# Patient Record
Sex: Female | Born: 1990 | Race: White | Hispanic: Yes | Marital: Married | Smoking: Never smoker
Health system: Southern US, Community
[De-identification: ages and names within clinical notes are randomized; demographics above are authoritative.]

## PROBLEM LIST (undated history)

## (undated) DIAGNOSIS — R42 Dizziness and giddiness: Secondary | ICD-10-CM

## (undated) DIAGNOSIS — G43909 Migraine, unspecified, not intractable, without status migrainosus: Secondary | ICD-10-CM

## (undated) DIAGNOSIS — T7421XA Adult sexual abuse, confirmed, initial encounter: Secondary | ICD-10-CM

## (undated) DIAGNOSIS — F5082 Avoidant/restrictive food intake disorder: Secondary | ICD-10-CM

## (undated) DIAGNOSIS — R202 Paresthesia of skin: Secondary | ICD-10-CM

## (undated) DIAGNOSIS — R109 Unspecified abdominal pain: Secondary | ICD-10-CM

## (undated) DIAGNOSIS — F509 Eating disorder, unspecified: Secondary | ICD-10-CM

## (undated) HISTORY — DX: Dizziness and giddiness: R42

## (undated) HISTORY — DX: Migraine, unspecified, not intractable, without status migrainosus: G43.909

## (undated) HISTORY — DX: Paresthesia of skin: R20.2

## (undated) HISTORY — DX: Unspecified abdominal pain: R10.9

---

## 2013-09-12 ENCOUNTER — Encounter (HOSPITAL_COMMUNITY): Payer: Self-pay | Admitting: Emergency Medicine

## 2013-09-12 ENCOUNTER — Emergency Department (HOSPITAL_COMMUNITY)
Admission: EM | Admit: 2013-09-12 | Discharge: 2013-09-12 | Disposition: A | Discharge status: Home / Self Care | Payer: Medicaid Other | Attending: Emergency Medicine | Admitting: Emergency Medicine

## 2013-09-12 DIAGNOSIS — M542 Cervicalgia: Secondary | ICD-10-CM | POA: Insufficient documentation

## 2013-09-12 DIAGNOSIS — Y939 Activity, unspecified: Secondary | ICD-10-CM | POA: Insufficient documentation

## 2013-09-12 DIAGNOSIS — W1809XA Striking against other object with subsequent fall, initial encounter: Secondary | ICD-10-CM | POA: Insufficient documentation

## 2013-09-12 DIAGNOSIS — W010XXA Fall on same level from slipping, tripping and stumbling without subsequent striking against object, initial encounter: Secondary | ICD-10-CM | POA: Insufficient documentation

## 2013-09-12 DIAGNOSIS — G8911 Acute pain due to trauma: Secondary | ICD-10-CM | POA: Insufficient documentation

## 2013-09-12 DIAGNOSIS — S298XXA Other specified injuries of thorax, initial encounter: Secondary | ICD-10-CM | POA: Insufficient documentation

## 2013-09-12 DIAGNOSIS — Y929 Unspecified place or not applicable: Secondary | ICD-10-CM | POA: Insufficient documentation

## 2013-09-12 DIAGNOSIS — S79929A Unspecified injury of unspecified thigh, initial encounter: Secondary | ICD-10-CM

## 2013-09-12 DIAGNOSIS — IMO0002 Reserved for concepts with insufficient information to code with codable children: Secondary | ICD-10-CM | POA: Insufficient documentation

## 2013-09-12 DIAGNOSIS — S0993XA Unspecified injury of face, initial encounter: Secondary | ICD-10-CM | POA: Insufficient documentation

## 2013-09-12 DIAGNOSIS — S6990XA Unspecified injury of unspecified wrist, hand and finger(s), initial encounter: Secondary | ICD-10-CM | POA: Insufficient documentation

## 2013-09-12 DIAGNOSIS — S59909A Unspecified injury of unspecified elbow, initial encounter: Secondary | ICD-10-CM | POA: Insufficient documentation

## 2013-09-12 DIAGNOSIS — S59919A Unspecified injury of unspecified forearm, initial encounter: Secondary | ICD-10-CM

## 2013-09-12 DIAGNOSIS — S79919A Unspecified injury of unspecified hip, initial encounter: Secondary | ICD-10-CM | POA: Insufficient documentation

## 2013-09-12 DIAGNOSIS — S199XXA Unspecified injury of neck, initial encounter: Secondary | ICD-10-CM

## 2013-09-12 DIAGNOSIS — W19XXXA Unspecified fall, initial encounter: Secondary | ICD-10-CM

## 2013-09-12 DIAGNOSIS — S0990XA Unspecified injury of head, initial encounter: Secondary | ICD-10-CM | POA: Insufficient documentation

## 2013-09-12 MED ORDER — IBUPROFEN 600 MG PO TABS: 600.0000 mg | ORAL_TABLET | Freq: Four times a day (QID) | ORAL | Status: DC | PRN

## 2013-09-12 NOTE — ED Notes (Signed)
Pt unable to urinate at this time.  

## 2013-09-12 NOTE — ED Provider Notes (Signed)
CSN: [id number]     Arrival date & time 09/12/13  0102 History   First MD Initiated Contact with Patient 09/12/13 0200     Chief Complaint  Patient presents with  . Fall     (Consider location/radiation/quality/duration/timing/severity/associated sxs/prior Treatment) HPI Comments: Patient states she slipped on the ice yesterday morning about 7 AM, hitting the back of her head on the concrete.  No loss of consciousness.  She's been having headache today.  Has not taken any medication.  She also has left elbow, left posterior chest, left lower back, and hip pain from her fall.  She has been able to go to school throughout the day.  Has been able to work after school. Patient.  Has not had any visual disturbances, nausea. She, states at first, she had some paresthesia of her left arm  Patient is a 23 y.o. female presenting with fall. The history is provided by the patient.  Fall This is a new problem. The current episode started yesterday. The problem occurs constantly. The problem has been unchanged. Associated symptoms include headaches and neck pain. Pertinent negatives include no fever, joint swelling, nausea or visual change. The symptoms are aggravated by exertion. She has tried nothing for the symptoms. The treatment provided no relief.    History reviewed. No pertinent past medical history. History reviewed. No pertinent past surgical history. No family history on file. History  Substance Use Topics  . Smoking status: Never Smoker   . Smokeless tobacco: Not on file  . Alcohol Use: No   OB History   Grav Para Term Preterm Abortions TAB SAB Ect Mult Living                 Review of Systems  Constitutional: Negative for fever.  Eyes: Negative for visual disturbance.  Gastrointestinal: Negative for nausea.  Musculoskeletal: Positive for back pain and neck pain. Negative for gait problem and joint swelling.  Neurological: Positive for headaches. Negative for dizziness.  All  other systems reviewed and are negative.      Allergies  Review of patient's allergies indicates no known allergies.  Home Medications   Current Outpatient Rx  Name  Route  Sig  Dispense  Refill  . ibuprofen (ADVIL,MOTRIN) 600 MG tablet   Oral   Take 1 tablet (600 mg total) by mouth every 6 (six) hours as needed.   30 tablet   0    BP 125/83  Pulse 80  Temp(Src) 98.4 F (36.9 C) (Oral)  Resp 16  SpO2 100% Physical Exam  Nursing note and vitals reviewed. Constitutional: She is oriented to person, place, and time. She appears well-developed and well-nourished.  HENT:  Head: Normocephalic.    Eyes: Pupils are equal, round, and reactive to light.  Neck: Normal range of motion.  Cardiovascular: Normal rate and regular rhythm.   Pulmonary/Chest: Effort normal.  Abdominal: Soft. Bowel sounds are normal.  Musculoskeletal: Normal range of motion. She exhibits tenderness.       Back:       Arms: Neurological: She is alert and oriented to person, place, and time. Coordination normal.  Skin: Skin is warm. No erythema.    ED Course  Procedures (including critical care time) Labs Review Labs Reviewed  PREGNANCY, URINE   Imaging Review No results found.   EKG Interpretation None      MDM  Patient was offered x-rays, refused at this time.  She's been given cautions to prompt a return she understands the risks  Final diagnoses:  Fall  Head injury  Elbow injury  Hip injury         Arman Filter, NP 09/12/13 [phone]

## 2013-09-12 NOTE — Discharge Instructions (Signed)
Return for any change in your condition

## 2013-09-12 NOTE — ED Notes (Signed)
Patient fell [place] her apartment [place], slipped on black ice.  She fell back hit her head and was seen here and treated yesterday here in the ED.  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  She presents today with neck pain and her head does not hurt unless she touches the sore spot.  She wants to see if she can have the x-ray done today.  She is still not wanting the CT but thinks she needs an x-ray.  She rates her pain in her neck 7/10.  She has taken ibuprofen for the pain and it is not helping.

## 2013-09-12 NOTE — ED Notes (Signed)
The pt ifell 0700am today in the ice striking the back of her head no loc.  She is also c/o a headache now with lt chest and  Posterior chest pain and lt arm pain.  She can now move the arm better than earlier.  Her headache is getting wrose

## 2013-09-13 ENCOUNTER — Emergency Department (HOSPITAL_COMMUNITY)
Admission: EM | Admit: 2013-09-13 | Discharge: 2013-09-13 | Discharge status: Against Medical Advice | Payer: Medicaid Other | Attending: Emergency Medicine | Admitting: Emergency Medicine

## 2013-09-13 NOTE — ED Provider Notes (Signed)
Medical screening examination/treatment/procedure(s) were performed by non-physician practitioner and as supervising physician I was immediately available for consultation/collaboration.   Tiani Stanbery, MD 09/13/13 0756 

## 2013-09-13 NOTE — ED Notes (Addendum)
Pt up to desk, wanting to leave. Explained to pt we have her room ready, we are taking you back right now. She was apologetic and stated she just wanted to go home and lie down. Stated, we have a place for you to lie down right now. She declined. Family with her. She stated, she was here last night, and that she would just come back in the morning. Encouraged her to stay to speak with provider before deciding to leave, that "I would get them now", she declined. Encouraged to stay, and/or: return if needed, changes mind, develops new or worsening sx. Alert, NAD, calm, steady gait.

## 2014-02-03 ENCOUNTER — Encounter (HOSPITAL_COMMUNITY): Payer: Self-pay | Admitting: Emergency Medicine

## 2014-02-03 ENCOUNTER — Emergency Department (HOSPITAL_COMMUNITY)
Admission: EM | Admit: 2014-02-03 | Discharge: 2014-02-03 | Disposition: A | Discharge status: Home / Self Care | Payer: Medicaid Other | Attending: Emergency Medicine | Admitting: Emergency Medicine

## 2014-02-03 DIAGNOSIS — B9689 Other specified bacterial agents as the cause of diseases classified elsewhere: Secondary | ICD-10-CM | POA: Insufficient documentation

## 2014-02-03 DIAGNOSIS — Z3202 Encounter for pregnancy test, result negative: Secondary | ICD-10-CM | POA: Insufficient documentation

## 2014-02-03 DIAGNOSIS — A499 Bacterial infection, unspecified: Secondary | ICD-10-CM | POA: Insufficient documentation

## 2014-02-03 DIAGNOSIS — N760 Acute vaginitis: Secondary | ICD-10-CM | POA: Insufficient documentation

## 2014-02-03 DIAGNOSIS — N921 Excessive and frequent menstruation with irregular cycle: Secondary | ICD-10-CM

## 2014-02-03 DIAGNOSIS — N898 Other specified noninflammatory disorders of vagina: Secondary | ICD-10-CM | POA: Insufficient documentation

## 2014-02-03 LAB — WET PREP, GENITAL
Trich, Wet Prep: NONE SEEN
Yeast Wet Prep HPF POC: NONE SEEN

## 2014-02-03 LAB — PREGNANCY, URINE: Preg Test, Ur: NEGATIVE

## 2014-02-03 LAB — URINALYSIS, ROUTINE W REFLEX MICROSCOPIC
Bilirubin Urine: NEGATIVE
Glucose, UA: NEGATIVE mg/dL
KETONES UR: NEGATIVE mg/dL
LEUKOCYTES UA: NEGATIVE
NITRITE: NEGATIVE
PH: 6 (ref 5.0–8.0)
PROTEIN: NEGATIVE mg/dL
Specific Gravity, Urine: 1.02 (ref 1.005–1.030)
UROBILINOGEN UA: 0.2 mg/dL (ref 0.0–1.0)

## 2014-02-03 LAB — I-STAT CHEM 8, ED
BUN: 9 mg/dL (ref 6–23)
CHLORIDE: 104 meq/L (ref 96–112)
Calcium, Ion: 1.18 mmol/L (ref 1.12–1.23)
Creatinine, Ser: 0.8 mg/dL (ref 0.50–1.10)
Glucose, Bld: 98 mg/dL (ref 70–99)
HCT: 39 % (ref 36.0–46.0)
Hemoglobin: 13.3 g/dL (ref 12.0–15.0)
Potassium: 4 mEq/L (ref 3.7–5.3)
Sodium: 140 mEq/L (ref 137–147)
TCO2: 24 mmol/L (ref 0–100)

## 2014-02-03 LAB — URINE MICROSCOPIC-ADD ON

## 2014-02-03 MED ORDER — METRONIDAZOLE 500 MG PO TABS: 500.0000 mg | ORAL_TABLET | Freq: Two times a day (BID) | ORAL | Status: DC

## 2014-02-03 NOTE — ED Provider Notes (Signed)
CSN: [id number]     Arrival date & time 02/03/14  1124 History   First MD Initiated Contact with Patient 02/03/14 1236     Chief Complaint  Patient presents with  . Vaginal Bleeding  . Urinary Frequency     HPI Pt was seen at 1310. Per pt, c/o gradual onset and persistence of multiple intermittent episodes of vaginal bleeding for the past month. Pt states she had her usual menses the beginning of the month. Pt took "Plan B" after her usual menses ended, and states she "started to bleed again" a few days later. Pt states that vaginal bleeding was "darker and heavier." Pt states now she is "just spotting." States she took Plan B "because I had sex and I wanted to reassure the guy I wasn't pregnant." Denies vaginal discharge, no CP/palpitations, no SOB, no abd pain, no back pain, no fevers, no rash.    History reviewed. No pertinent past medical history.  History reviewed. No pertinent past surgical history.  History  Substance Use Topics  . Smoking status: Never Smoker   . Smokeless tobacco: Not on file  . Alcohol Use: No    Review of Systems .ROS: Statement: All systems negative except as marked or noted in the HPI; Constitutional: Negative for fever and chills. ; ; Eyes: Negative for eye pain, redness and discharge. ; ; ENMT: Negative for ear pain, hoarseness, nasal congestion, sinus pressure and sore throat. ; ; Cardiovascular: Negative for chest pain, palpitations, diaphoresis, dyspnea and peripheral edema. ; ; Respiratory: Negative for cough, wheezing and stridor. ; ; Gastrointestinal: Negative for nausea, vomiting, diarrhea, abdominal pain, blood in stool, hematemesis, jaundice and rectal bleeding. . ; ; Genitourinary: Negative for dysuria, flank pain and hematuria. ; ; GYN:  +vaginal bleeding, no vaginal discharge, no vulvar pain.;;  Musculoskeletal: Negative for back pain and neck pain. Negative for swelling and trauma.; ; Skin: Negative for pruritus, rash, abrasions, blisters,  bruising and skin lesion.; ; Neuro: Negative for headache, lightheadedness and neck stiffness. Negative for weakness, altered level of consciousness , altered mental status, extremity weakness, paresthesias, involuntary movement, seizure and syncope.       Allergies  Review of patient's allergies indicates no known allergies.  Home Medications   Prior to Admission medications   Medication Sig Start Date End Date Taking? Authorizing Provider  levonorgestrel (PLAN B,NEXT CHOICE) 0.75 MG tablet Take 0.75 mg by mouth every 12 (twelve) hours.   Yes Historical Provider, MD   BP 119/75  Pulse 75  Temp(Src) 98.4 F (36.9 C) (Oral)  Resp 16  SpO2 98%  LMP 01/24/2014 Physical Exam 1315: Physical examination:  Nursing notes reviewed; Vital signs and O2 SAT reviewed;  Constitutional: Well developed, Well nourished, Well hydrated, In no acute distress; Head:  Normocephalic, atraumatic; Eyes: EOMI, PERRL, No scleral icterus; ENMT: Mouth and pharynx normal, Mucous membranes moist; Neck: Supple, Full range of motion, No lymphadenopathy; Cardiovascular: Regular rate and rhythm, No murmur, rub, or gallop; Respiratory: Breath sounds clear & equal bilaterally, No rales, rhonchi, wheezes.  Speaking full sentences with ease, Normal respiratory effort/excursion; Chest: Nontender, Movement normal; Abdomen: Soft, Nontender, Nondistended, Normal bowel sounds; Genitourinary: No CVA tenderness. Pelvic exam performed with permission of pt and female ED tech assist during exam.  External genitalia w/o lesions. Vaginal vault without discharge, +small amount of dark blood in vault.  Cervix w/o lesions, not friable, GC/chlam and wet prep obtained and sent to lab.  Bimanual exam w/o CMT, uterine or adnexal tenderness.;; Extremities:  Pulses normal, No tenderness, No edema, No calf edema or asymmetry.; Neuro: AA&Ox3, Major CN grossly intact.  Speech clear. No gross focal motor or sensory deficits in extremities. Climbs on and  off stretcher easily by herself. Gait steady.; Skin: Color normal, Warm, Dry.   ED Course  Procedures     MDM  MDM Reviewed: previous chart, nursing note and vitals Interpretation: labs   Results for orders placed during the hospital encounter of 02/03/14  WET PREP, GENITAL      Result Value Ref Range   Yeast Wet Prep HPF POC NONE SEEN  NONE SEEN   Trich, Wet Prep NONE SEEN  NONE SEEN   Clue Cells Wet Prep HPF POC FEW (*) NONE SEEN   WBC, Wet Prep HPF POC FEW (*) NONE SEEN  URINALYSIS, ROUTINE W REFLEX MICROSCOPIC      Result Value Ref Range   Color, Urine AMBER (*) YELLOW   APPearance CLOUDY (*) CLEAR   Specific Gravity, Urine 1.020  1.005 - 1.030   pH 6.0  5.0 - 8.0   Glucose, UA NEGATIVE  NEGATIVE mg/dL   Hgb urine dipstick LARGE (*) NEGATIVE   Bilirubin Urine NEGATIVE  NEGATIVE   Ketones, ur NEGATIVE  NEGATIVE mg/dL   Protein, ur NEGATIVE  NEGATIVE mg/dL   Urobilinogen, UA 0.2  0.0 - 1.0 mg/dL   Nitrite NEGATIVE  NEGATIVE   Leukocytes, UA NEGATIVE  NEGATIVE  PREGNANCY, URINE      Result Value Ref Range   Preg Test, Ur NEGATIVE  NEGATIVE  URINE MICROSCOPIC-ADD ON      Result Value Ref Range   Squamous Epithelial / LPF RARE  RARE   WBC, UA 3-6  <3 WBC/hpf   RBC / HPF TOO NUMEROUS TO COUNT  <3 RBC/hpf   Bacteria, UA FEW (*) RARE  I-STAT CHEM 8, ED      Result Value Ref Range   Sodium 140  137 - 147 mEq/L   Potassium 4.0  3.7 - 5.3 mEq/L   Chloride 104  96 - 112 mEq/L   BUN 9  6 - 23 mg/dL   Creatinine, Ser 4.090.80  0.50 - 1.10 mg/dL   Glucose, Bld 98  70 - 99 mg/dL   Calcium, Ion 8.111.18  9.141.12 - 1.23 mmol/L   TCO2 24  0 - 100 mmol/L   Hemoglobin 13.3  12.0 - 15.0 g/dL   HCT 78.239.0  95.636.0 - 21.346.0 %    1425:  Pt ambulatory with steady gait. VS remain stable. Vaginal bleeding likely due to taking Plan B. D/W pt regarding same; verb understanding. Will tx for BV. Pt wants to go home now. Dx and testing d/w pt.  Questions answered.  Verb understanding, agreeable to d/c  home with outpt f/u.     Laray AngerKathleen M Octavian Godek, DO 02/06/14 [id number]
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
## 2014-02-03 NOTE — Discharge Instructions (Signed)
°Emergency Department Resource Guide °1) Find a Doctor and Pay Out of Pocket °Although you won't have to find out who is covered by your insurance plan, it is a good idea to ask around and get recommendations. You will then need to call the office and see if the doctor you have chosen will accept you as a new patient and what types of options they offer for patients who are self-pay. Some doctors offer discounts or will set up payment plans for their patients who do not have insurance, but you will need to ask so you aren't surprised when you get to your appointment. ° °2) Contact Your Local Health Department °Not all health departments have doctors that can see patients for sick visits, but many do, so it is worth a call to see if yours does. If you don't know where your local health department is, you can check in your phone book. The CDC also has a tool to help you locate your state's health department, and many state websites also have listings of all of their local health departments. ° °3) Find a Walk-in Clinic °If your illness is not likely to be very severe or complicated, you may want to try a walk in clinic. These are popping up all over the country in pharmacies, drugstores, and shopping centers. They're usually staffed by nurse practitioners or physician assistants that have been trained to treat common illnesses and complaints. They're usually fairly quick and inexpensive. However, if you have serious medical issues or chronic medical problems, these are probably not your best option. ° °No Primary Care Doctor: °- Call [facility] at  832-8000 - they can help you locate a primary care doctor that  accepts your insurance, provides certain services, etc. °- Physician Referral Service- 1-800-533-3463 ° °Chronic Pain Problems: °Organization         Address  Phone   Notes  °[facility]  (336) 297-2271 Patients need to be referred by their primary care doctor.  ° °Medication  Assistance: °Organization         Address  Phone   Notes  °[place] [facility] [address] °[place], [place] [address] (336) 641-8030 --Must be a resident of [place] °-- Must have NO insurance coverage whatsoever (no Medicaid/ Medicare, etc.) °-- The pt. MUST have a primary care doctor that directs their care regularly and follows them in the community °  °[facility]  (866) 331-1348   °[employer]  (888) 892-1162   ° °Agencies that provide inexpensive medical care: °Organization         Address  Phone   Notes  °[facility]  (336) 832-8035   °[facility]    (336) 832-7272   °[facility] [address] °[place], [place] [address] (336) 832-4777   °[facility] [address], °[place] (336) 271-4999   °[facility]    (336) 373-0678   °[facility]    (336) 272-1050   °[facility] ° [address], [place] Phone:  (336) 832-4444, Fax:  (336) 832-4440 Hours of Operation:  9 am - 6 pm, M-F.  Also accepts Medicaid/Medicare and self-pay.  °[facility] ° [address], [place] Phone: (336) 832-3150, Fax: (336) 832-3151. Hours of Operation:  8:30 am - 5:30 pm, M-F.  Also accepts Medicaid and self-pay.  °[facility] [place] [address], [place] Phone: (336) 878-6027   °[facility] [address], [place], [place] (336)723-1848, Ext. 123 Mondays & Thursdays: 7-9 AM.  First 15 patients are seen on a first come, first serve basis. °  ° °Medicaid-accepting [place] Providers: ° °Organization         Address  Phone   Notes  °[facility] [address], [place] (336) 641-2100 Also accepts self-pay patients.  °[facility] [address], [place] ° (336) 856-9996   °[facility] [address], [place]  (336) 288-8857   °[facility] [address] [place] [address], [place] (336) 299-7000   °Veita Bland [address], [place]  ° (336) 373-1557 Only accepts [place] Access Medicaid patients after they have their name applied to their card.  ° °Self-Pay (no insurance) in [place]: ° °Organization         Address  Phone   Notes  °Sickle Cell Patients, [facility] [address], [place] (336) 832-1970   °[facility] [address], [place] (336) 832-4400   °[facility] ° [address], [place] (336) 992-4800   °[facility]/Dr. Osei-Bonsu ° [address] [place] [address], [place] or [address], [place] (336) 841-8500 Phone number for both [place] and [place] locations is the same.  °[facility] [address], [place] (336) 299-0000   °[facility] [address] [place] [address], [place] or [address] (336) 852-7530 °(336) 878-2260   °[facility] [address], [place] (336) 350-1642, phone; (336) 294-5005, fax Sees patients 1st and 3rd Saturday of every month.  Must not qualify for public or private insurance (i.e. Medicaid, Medicare, [place] Health Choice, Veterans' Benefits) • Household income should be no more than 200% of the poverty level •The clinic cannot treat you if you are pregnant or think you are pregnant • Sexually transmitted diseases are not treated at the clinic.  ° ° °Dental Care: °Organization         Address  Phone  Notes  °[place] [facility] [address], [place] (336) 641-6152 Accepts children up to age 21 who are enrolled in Medicaid or [place] Health Choice; pregnant women with a Medicaid card; and children who have applied for Medicaid or [place] Health Choice, but were declined, whose parents can pay a reduced fee at time of service.  °[place]  [facility] [place]  [address], [place] (336) 641-7733 Accepts children up to age 21 who are enrolled in Medicaid or [place] Health Choice; pregnant women with a Medicaid card; and children who have applied for Medicaid or [place] Health Choice, but were declined, whose parents can pay a reduced fee at time of service.  °[facility] ° [address], [place] (336) 641-4533 Patients are seen by appointment only. Walk-ins are not accepted. [facility] will see patients 18 years of age and older. °Monday - Tuesday (8am-5pm) °Most Wednesdays (8:30-5pm) °$30 per visit, cash only  °[facility] ° [address], [place] (336) 641-4533 Patients are seen by appointment only. Walk-ins are not accepted. [facility] will see patients 18 years of age and older. °One   Wednesday Evening (Monthly: Volunteer Based).  $30 per visit, cash only  °[facility]  (919) 537-3737 for adults; Children under age 4, call Graduate Pediatric Dentistry at (919) [phone]. Children aged 4-14, please call (919) [phone] to request a pediatric application. ° Dental services are provided in all areas of dental care including fillings, crowns and bridges, complete and partial dentures, implants, gum treatment, root canals, and extractions. Preventive care is also provided. Treatment is provided to both adults and children. °Patients are selected via a lottery and there is often a waiting list. °  °[facility] [address], °[place] ° (336) 763-8833 [email or web address] °  °[facility] [address], [place], [place] (336)723-1848, Ext. 123 Second and Fourth Thursday of each month, opens at 6:30 AM; Clinic ends at 9 AM.  Patients are seen on a first-come first-served basis, and a limited number are seen during each clinic.  ° °[facility] ° [address], [place], [place] (336) 723-7904    Eligibility Requirements °You must have lived in [place], or [place] counties for at least the last three months. °  You cannot be eligible for state or federal sponsored healthcare insurance, including Veterans Administration, Medicaid, or Medicare. °  You generally cannot be eligible for healthcare insurance through your employer.  °  How to apply: °Eligibility screenings are held every Tuesday and Wednesday afternoon from 1:00 pm until 4:00 pm. You do not need an appointment for the interview!  °[facility] [address], [place], [place] 336-631-2330   °[facility]  336-342-8273   °[facility]  336-703-3100   °[facility]  336-570-6415   ° °Behavioral Health Resources in the Community: °Intensive Outpatient Programs °Organization         Address  Phone  Notes  °[facility] [address], [place], [place] 336-878-6098   °[facility] [address], [place], [place] 336-832-9800   °[facility] [address], [place], [place] ° 336-882-2125   °[facility] [address],  °[place], [place] 1-800-853-5163 or 336-641-4981   °Substance Abuse Resources °Organization         Address  Phone  Notes  °[facility]  336-882-2125   °[facility]  336-784-9470   °[facility]  336-285-9073   °[facility]  336-845-3988   °Residential & Outpatient Substance Abuse Program  1-800-659-3381   °Psychological Services °Organization         Address  Phone  Notes  °[facility]  336- 832-9600   °[facility]  336- 378-7881   °[facility] [address], [place] 1-800-853-5163 or 336-641-4981   ° °Mobile Crisis Teams °Organization         Address  Phone  Notes  °[facility], Mobile Crisis Care Unit  1-877-626-1772   °Assertive °[facility] ° [address]  [place], [place] 336-834-9664   °Sharon DeEsch [address] °[place] 336-554-5454   ° °Self-Help/Support Groups °Organization         Address  Phone             Notes  °[facility]  - variety of support groups  336- 373-1402 Call for more information  °Narcotics Anonymous (NA), [facility] [address], °[place]  2 meetings at this location  ° °  Residential Treatment Programs °Organization         Address  Phone  Notes  °[facility] [address],    °[place]  1-866-801-8205   °[facility] ° [address], [place], [place] 704-293-8524   °[facility] [address], [place] 336-845-3988 Admissions: 8am-3pm M-F  °[facility] [address]    °[place], [place] 336-841-1104   °[facility] [address], [place], [place] 336-379-7146   °[facility] [address]  °[place], [place] 336-285-9073   °[facility] Intensive Outpatient [address], [place], [place] 336-852-3033   °[facility] [address]  °[place], [place] 1-877-615-2722 or 336-784-9470   °[facility] [address] [place], [place] 336-227-7417 Accepts Medicaid  °[facility] [address]  °[place] 1-800-659-3381 Substance Abuse/Addiction Treatment  ° °[place] Behavioral Health Resources °Organization         Address  Phone  Notes  °[facility]  (888) 581-9988   °Julie Brannon, PhD [address] [place], [place]   (336) 349-5553 or (336) 951-0000   °[facility]   [address] °[place], [place] (336) 349-4454   °[facility] [address], [place], [place] (336) 342-8316 Insurance/Medicaid/sponsorship through [facility]  °[facility] [address]                                    [place], [place] (336) 342-8316 Therapy/tele-psych/case    °[facility] [address]  ° [place], [place] (336) 349-2233    °Dr. Arfeen  (336) 349-4544   °[facility] [place]  [employer] [place] [facility] 1) [address], [place] °2) [address], [place] °3)  [address], [place] (336) 349-3220 °(336) 342-7768 ° °(336) 342-8140   °[place] Child Abuse [facility] (336) 342-1394 or (336) 342-3537 (After Hours)    ° ° ° °Take the prescription as directed.  Call your regular OB/GYN doctor today to schedule a follow up appointment within the next week.  Return to the Emergency Department immediately sooner if worsening.  ° °

## 2014-02-03 NOTE — Progress Notes (Signed)
[facility] did not get to see patient but will be sending information about [facility] program to help patient establish primary care.

## 2014-02-04 LAB — GC/CHLAMYDIA PROBE AMP
CT PROBE, AMP APTIMA: NEGATIVE
GC Probe RNA: NEGATIVE

## 2015-01-20 ENCOUNTER — Emergency Department (HOSPITAL_COMMUNITY)
Admission: EM | Admit: 2015-01-20 | Discharge: 2015-01-21 | Disposition: A | Discharge status: Home / Self Care | Payer: Medicaid Other | Attending: Emergency Medicine | Admitting: Emergency Medicine

## 2015-01-20 ENCOUNTER — Encounter (HOSPITAL_COMMUNITY): Payer: Self-pay

## 2015-01-20 DIAGNOSIS — J029 Acute pharyngitis, unspecified: Secondary | ICD-10-CM

## 2015-01-20 DIAGNOSIS — R000 Tachycardia, unspecified: Secondary | ICD-10-CM | POA: Insufficient documentation

## 2015-01-20 LAB — RAPID STREP SCREEN (MED CTR MEBANE ONLY): STREPTOCOCCUS, GROUP A SCREEN (DIRECT): NEGATIVE

## 2015-01-20 NOTE — ED Notes (Signed)
Patient reports she awoke this morning with a sore throat, body aches and fever.

## 2015-01-21 MED ORDER — MAGIC MOUTHWASH W/LIDOCAINE: 5.0000 mL | Freq: Three times a day (TID) | ORAL | Status: DC | PRN

## 2015-01-21 NOTE — Discharge Instructions (Signed)
Use magic mouthwash as needed for sore throat. Refer to attached documents for more information.

## 2015-01-21 NOTE — ED Provider Notes (Signed)
CSN: [id number]     Arrival date & time 01/20/15  2228 History   First MD Initiated Contact with Patient 01/20/15 2338     Chief Complaint  Patient presents with  . Sore Throat     (Consider location/radiation/quality/duration/timing/severity/associated sxs/prior Treatment) HPI Comments: Patient is a 24 year old female who presents with a 1 day history of sore throat. Patient reports gradual onset and progressively worsening sharp, severe throat pain. The pain is constant and made worse with swallowing. The pain is localized to the patient's throat and equal on both sides. Nothing alleviates the pain. The patient has not tried anything for symptom relief. Patient reports associated fever, cervical adenopathy, and body aches. Patient denies headache, visual changes, sinus congestion, difficulty breathing, chest pain, SOB, abdominal pain, NVD. Patient reports her girlfriend recently had hand, foot, and mouth disease.      History reviewed. No pertinent past medical history. History reviewed. No pertinent past surgical history. No family history on file. History  Substance Use Topics  . Smoking status: Never Smoker   . Smokeless tobacco: Not on file  . Alcohol Use: No   OB History    No data available     Review of Systems  Constitutional: Negative for fever, chills and fatigue.  HENT: Negative for trouble swallowing.   Eyes: Negative for visual disturbance.  Respiratory: Negative for shortness of breath.   Cardiovascular: Negative for chest pain and palpitations.  Gastrointestinal: Negative for nausea, vomiting, abdominal pain and diarrhea.  Genitourinary: Negative for dysuria and difficulty urinating.  Musculoskeletal: Negative for arthralgias and neck pain.  Skin: Negative for color change.  Neurological: Negative for dizziness and weakness.  Psychiatric/Behavioral: Negative for dysphoric mood.      Allergies  Review of patient's allergies indicates no known  allergies.  Home Medications   Prior to Admission medications   Medication Sig Start Date End Date Taking? Authorizing Provider  aspirin-acetaminophen-caffeine (EXCEDRIN MIGRAINE) [phone] MG per tablet Take 2 tablets by mouth every 6 (six) hours as needed for headache.   Yes Historical Provider, MD  ferrous fumarate (HEMOCYTE - 106 MG FE) 325 (106 FE) MG TABS tablet Take 1 tablet by mouth once.   Yes Historical Provider, MD  ibuprofen (ADVIL,MOTRIN) 200 MG tablet Take 400 mg by mouth every 6 (six) hours as needed for moderate pain.   Yes Historical Provider, MD  Alum & Mag Hydroxide-Simeth (MAGIC MOUTHWASH W/LIDOCAINE) SOLN Take 5 mLs by mouth 3 (three) times daily as needed for mouth pain. 01/21/15   Alean Kromer, PA-C  metroNIDAZOLE (FLAGYL) 500 MG tablet Take 1 tablet (500 mg total) by mouth 2 (two) times daily. Patient not taking: Reported on 01/20/2015 02/03/14   Samuel Jester, DO   BP 127/77 mmHg  Pulse 105  Temp(Src) 100.1 F (37.8 C) (Oral)  Resp 20  Ht  (1.626 m)  Wt 130 lb (58.968 kg)  BMI 22.30 kg/m2  SpO2 100%  LMP 01/17/2015 (Approximate) Physical Exam  Constitutional: She is oriented to person, place, and time. She appears well-developed and well-nourished. No distress.  HENT:  Head: Normocephalic and atraumatic.  Mouth/Throat: No oropharyngeal exudate.  2 isolated erythematous papules in posterior pharynx.   Eyes: Conjunctivae are normal. No scleral icterus.  Neck: Normal range of motion.  Cardiovascular: Normal rate and regular rhythm.  Exam reveals no gallop and no friction rub.   No murmur heard. Pulmonary/Chest: Effort normal and breath sounds normal. She has no wheezes. She has no rales. She exhibits  no tenderness.  Abdominal: Soft. There is no tenderness.  Musculoskeletal: Normal range of motion.  Neurological: She is alert and oriented to person, place, and time. Coordination normal.  Speech is goal-oriented. Moves limbs without ataxia.   Skin:  Skin is warm and dry.  Psychiatric: She has a normal mood and affect. Her behavior is normal.  Nursing note and vitals reviewed.   ED Course  Procedures (including critical care time) Labs Review Labs Reviewed  RAPID STREP SCREEN (NOT AT [facility])  CULTURE, GROUP A STREP    Imaging Review No results found.   EKG Interpretation None      MDM   Final diagnoses:  Sore throat    12:23 AM Rapid strep negative. Patient's rapid strep negative. Patient's girlfriend recently had hand, foot and mouth disease which is likely what the patient is developing. Patient with a low grade temp and mild tachycardia due to temperature.     Emilia BeckKaitlyn Kevan Prouty, PA-C 01/21/15 0029  April Palumbo, MD 01/21/15 [phone]

## 2015-01-23 LAB — CULTURE, GROUP A STREP: STREP A CULTURE: NEGATIVE

## 2015-04-13 ENCOUNTER — Emergency Department (HOSPITAL_COMMUNITY)
Admission: EM | Admit: 2015-04-13 | Discharge: 2015-04-14 | Disposition: A | Discharge status: Home / Self Care | Payer: Medicaid Other | Attending: Emergency Medicine | Admitting: Emergency Medicine

## 2015-04-13 DIAGNOSIS — K122 Cellulitis and abscess of mouth: Secondary | ICD-10-CM | POA: Insufficient documentation

## 2015-04-13 DIAGNOSIS — Z79899 Other long term (current) drug therapy: Secondary | ICD-10-CM | POA: Insufficient documentation

## 2015-04-13 NOTE — ED Notes (Signed)
Facial swelling for 2-3 days noticed after piercing on her lower lip. Piercing was done 2 months ago. She was under the weather causing her to feel weak and slightly neglect to clean the site. She has pain on lip, left jaw, and cheek. Unable to eat and has no appetite normally but is unable to eat due to the pain. She has taken Benadryl and generic ATB cream and oral mouth rinse, however the symtpms were not relieved.   She also c/o chest tightness and burning for 6 hours.

## 2015-04-14 ENCOUNTER — Encounter (HOSPITAL_COMMUNITY): Payer: Self-pay | Admitting: *Deleted

## 2015-04-14 MED ORDER — CEPHALEXIN 500 MG PO CAPS: 500.0000 mg | ORAL_CAPSULE | Freq: Four times a day (QID) | ORAL | Status: DC

## 2015-04-14 MED ORDER — SULFAMETHOXAZOLE-TRIMETHOPRIM 800-160 MG PO TABS: 1.0000 | ORAL_TABLET | Freq: Two times a day (BID) | ORAL | Status: AC

## 2015-04-14 NOTE — Discharge Instructions (Signed)
Take the antibiotics prescribed.  Although there is little evidence to guide this decision, we generally suggest that piercings be removed during treatment of localized infection because of concerns about contamination of hardware and/or continued use of commercial aftercare products. There are products that can be used to keep the piercing site patent.  Return to the ER if you have increased pain, swelling, headaches, confusion, nausea, emesis, seizures.

## 2015-04-14 NOTE — ED Provider Notes (Signed)
CSN: [id number]     Arrival date & time 04/13/15  2207 History  By signing my name below, I, Emmanuella Mensah, attest that this documentation has been prepared under the direction and in the presence of Derwood Kaplan, MD. Electronically Signed: Angelene Giovanni, ED Scribe. 04/14/2015. 8:43 AM.     No chief complaint on file.  The history is provided by the patient. No language interpreter was used.   HPI Comments: Diane Wood is a 24 y.o. female who presents to the Emergency Department complaining of gradually worsening left lower lip swelling onset 2-3 days ago. She reports associated difficulty eating and speaking due to the pain. She reports that she had her lip piercing 2 months ago and now there is a possibly infection in the area. She reports that she had a cold about a week ago and she treated her symptoms with OTC medication. She adds that she had not been cleaning the area properly during the cold. She states that she has taken Benadryl, antibiotic cream, or an oral mouth rinse for cold sores with no relief. She denies any dental issues. She explains that she is here today because she wants to be able to eat and speak so she can be efficient at work. Pt reports that she was told to not remove her piercing if it is infected.   History reviewed. No pertinent past medical history. History reviewed. No pertinent past surgical history. Family History  Problem Relation Age of Onset  . Cancer Mother   . Cancer Father    Social History  Substance Use Topics  . Smoking status: Never Smoker   . Smokeless tobacco: None  . Alcohol Use: 2.4 oz/week    4 Cans of beer per week   OB History    Gravida Para Term Preterm AB TAB SAB Ectopic Multiple Living   Review of Systems  Constitutional: Negative for fever.  HENT: Positive for facial swelling.        Trouble eating      Allergies  Review of patient's allergies indicates no known allergies.  Home  Medications   Prior to Admission medications   Medication Sig Start Date End Date Taking? Authorizing Provider  aspirin-acetaminophen-caffeine (EXCEDRIN MIGRAINE) [phone] MG per tablet Take 2 tablets by mouth every 6 (six) hours as needed for headache.   Yes Historical Provider, MD  diphenhydrAMINE (BENADRYL) 25 mg capsule Take 25 mg by mouth every 6 (six) hours as needed for allergies.   Yes Historical Provider, MD  ibuprofen (ADVIL,MOTRIN) 200 MG tablet Take 400 mg by mouth every 6 (six) hours as needed for moderate pain.   Yes Historical Provider, MD  OVER THE COUNTER MEDICATION Take 1 application by mouth daily.   Yes Historical Provider, MD  Alum & Mag Hydroxide-Simeth (MAGIC MOUTHWASH W/LIDOCAINE) SOLN Take 5 mLs by mouth 3 (three) times daily as needed for mouth pain. Patient not taking: Reported on 04/13/2015 01/21/15   Emilia Beck, PA-C  cephALEXin (KEFLEX) 500 MG capsule Take 1 capsule (500 mg total) by mouth 4 (four) times daily. 04/14/15   Derwood Kaplan, MD  metroNIDAZOLE (FLAGYL) 500 MG tablet Take 1 tablet (500 mg total) by mouth 2 (two) times daily. Patient not taking: Reported on 01/20/2015 02/03/14   Samuel Jester, DO  sulfamethoxazole-trimethoprim (BACTRIM DS,SEPTRA DS) 800-160 MG tablet Take 1 tablet by mouth 2 (two) times daily. 04/14/15 04/21/15  Derwood Kaplan, MD  BP 124/87 mmHg  Pulse 96  Temp(Src) 98.2 F (36.8 C) (Oral)  Resp 16  SpO2 100% Physical Exam  Constitutional: She is oriented to person, place, and time. She appears well-developed and well-nourished.  HENT:  Head: Normocephalic and atraumatic.  Negative trismus  No gingiva swelling, no fluctuance, no swelling, not TTP expect gingiva covering tooth #16 Pt has a lip piercing below the left lower lip. Mild mucosal swelling, no sloughing around the piercing and may be the lower lip. No facial redness, assymetry or swelling appreciated    Cardiovascular: Normal rate.   Pulmonary/Chest: Effort  normal.  Abdominal: She exhibits no distension.  Lymphadenopathy:    She has no cervical adenopathy.  Neurological: She is alert and oriented to person, place, and time.  Skin: Skin is warm and dry.  Psychiatric: She has a normal mood and affect.  Nursing note and vitals reviewed.   ED Course  Procedures (including critical care time) DIAGNOSTIC STUDIES: Oxygen Saturation is 100% on RA, normal by my interpretation.    COORDINATION OF CARE: 1:11 AM- Pt advised of plan for treatment and pt agrees.    Labs Review Labs Reviewed - No data to display  Imaging Review No results found. No att. providers found has personally reviewed and evaluated these images and lab results as part of his medical decision-making.   EKG Interpretation None      MDM   Final diagnoses:  Cellulitis of mouth    I personally performed the services described in this documentation, which was scribed in my presence. The recorded information has been reviewed and is accurate.  ? Mild infection around the piercing. Pt has attempted OTC meds with no relief - so we will go with bactrim and keflex and cover MSSA. Return precautions discussed. No signs of deep infection.   Derwood Kaplan, MD 04/15/15 [phone]

## 2015-04-16 ENCOUNTER — Encounter (HOSPITAL_COMMUNITY): Payer: Self-pay | Admitting: Oncology

## 2015-04-16 ENCOUNTER — Emergency Department (HOSPITAL_COMMUNITY)
Admission: EM | Admit: 2015-04-16 | Discharge: 2015-04-17 | Disposition: A | Discharge status: Home / Self Care | Payer: Medicaid Other | Attending: Emergency Medicine | Admitting: Emergency Medicine

## 2015-04-16 DIAGNOSIS — Z792 Long term (current) use of antibiotics: Secondary | ICD-10-CM | POA: Insufficient documentation

## 2015-04-16 DIAGNOSIS — Z79899 Other long term (current) drug therapy: Secondary | ICD-10-CM | POA: Insufficient documentation

## 2015-04-16 DIAGNOSIS — R590 Localized enlarged lymph nodes: Secondary | ICD-10-CM | POA: Insufficient documentation

## 2015-04-16 DIAGNOSIS — R252 Cramp and spasm: Secondary | ICD-10-CM | POA: Insufficient documentation

## 2015-04-16 DIAGNOSIS — J029 Acute pharyngitis, unspecified: Secondary | ICD-10-CM

## 2015-04-16 MED ORDER — OXYCODONE-ACETAMINOPHEN 5-325 MG PO TABS
1.0000 | ORAL_TABLET | Freq: Once | ORAL | Status: AC
  Administered 2015-04-16: 1 via ORAL
  Filled 2015-04-16: qty 1

## 2015-04-16 MED ORDER — DEXAMETHASONE SODIUM PHOSPHATE 10 MG/ML IJ SOLN
10.0000 mg | Freq: Once | INTRAMUSCULAR | Status: AC
  Administered 2015-04-17: 10 mg via INTRAMUSCULAR
  Filled 2015-04-16: qty 1

## 2015-04-16 NOTE — ED Notes (Signed)
PA at bedside.

## 2015-04-16 NOTE — ED Provider Notes (Signed)
CSN: [id number]     Arrival date & time 04/16/15  2213 History   First MD Initiated Contact with Patient 04/16/15 2243     Chief Complaint  Patient presents with  . mouth pain      (Consider location/radiation/quality/duration/timing/severity/associated sxs/prior Treatment) HPI  Diane Wood is a 23 y.o F with no significant pmhx who presents to the ED c/o sore throat, trismus, and painful chewing/swallowing. Pt was seen 3 days ago for an infected lip piercing but failed to pick up the abx. Pt did not have sore throat at this time. Now with worsening pain. Pt states"it feels like my throat and roof of my mouth is on fire". Denies feer, chills, vomiting, chest pain, difficulty breathing, change in voice, drooling.  History reviewed. No pertinent past medical history. History reviewed. No pertinent past surgical history. Family History  Problem Relation Age of Onset  . Cancer Mother   . Cancer Father    Social History  Substance Use Topics  . Smoking status: Never Smoker   . Smokeless tobacco: None  . Alcohol Use: 2.4 oz/week    4 Cans of beer per week   OB History    Gravida Para Term Preterm AB TAB SAB Ectopic Multiple Living   Review of Systems  HENT: Negative for dental problem, drooling, facial swelling, postnasal drip and sinus pressure.       Allergies  Review of patient's allergies indicates no known allergies.  Home Medications   Prior to Admission medications   Medication Sig Start Date End Date Taking? Authorizing Provider  Alum & Mag Hydroxide-Simeth (MAGIC MOUTHWASH W/LIDOCAINE) SOLN Take 5 mLs by mouth 3 (three) times daily as needed for mouth pain. Patient not taking: Reported on 04/13/2015 01/21/15   Emilia Beck, PA-C  aspirin-acetaminophen-caffeine (EXCEDRIN MIGRAINE) [phone] MG per tablet Take 2 tablets by mouth every 6 (six) hours as needed for headache.    Historical Provider, MD  cephALEXin (KEFLEX) 500 MG capsule Take 1  capsule (500 mg total) by mouth 4 (four) times daily. 04/14/15   Derwood Kaplan, MD  diphenhydrAMINE (BENADRYL) 25 mg capsule Take 25 mg by mouth every 6 (six) hours as needed for allergies.    Historical Provider, MD  ibuprofen (ADVIL,MOTRIN) 200 MG tablet Take 400 mg by mouth every 6 (six) hours as needed for moderate pain.    Historical Provider, MD  metroNIDAZOLE (FLAGYL) 500 MG tablet Take 1 tablet (500 mg total) by mouth 2 (two) times daily. Patient not taking: Reported on 01/20/2015 02/03/14   Samuel Jester, DO  OVER THE COUNTER MEDICATION Take 1 application by mouth daily.    Historical Provider, MD  sulfamethoxazole-trimethoprim (BACTRIM DS,SEPTRA DS) 800-160 MG tablet Take 1 tablet by mouth 2 (two) times daily. 04/14/15 04/21/15  Ankit Nanavati, MD   BP 128/87 mmHg  Pulse 97  Temp(Src) 98.1 F (36.7 C) (Oral)  Resp 16  SpO2 100%  LMP 04/12/2015 (Approximate) Physical Exam  Constitutional: She is oriented to person, place, and time. She appears well-developed and well-nourished. No distress.  HENT:  Head: Normocephalic and atraumatic.  Mouth/Throat: Uvula is midline. No dental abscesses or uvula swelling. Oropharyngeal exudate and posterior oropharyngeal erythema present. No posterior oropharyngeal edema or tonsillar abscesses.  Trismus present. TTP of TMJ bilaterally.  Eyes: Conjunctivae and EOM are normal. Pupils are equal, round, and reactive to light. Right eye exhibits no discharge. Left eye exhibits no discharge. No  scleral icterus.  Neck: Normal range of motion.  Cardiovascular: Normal rate, regular rhythm, normal heart sounds and intact distal pulses.  Exam reveals no gallop and no friction rub.   No murmur heard. Pulmonary/Chest: Effort normal and breath sounds normal. No respiratory distress. She has no wheezes. She has no rales. She exhibits no tenderness.  Abdominal: Soft. Bowel sounds are normal. She exhibits no distension and no mass. There is no tenderness. There is  no rebound and no guarding.  Musculoskeletal: Normal range of motion. She exhibits no edema.  Lymphadenopathy:    She has cervical adenopathy.  Neurological: She is alert and oriented to person, place, and time.  Skin: Skin is warm and dry. No rash noted. She is not diaphoretic. No erythema. No pallor.  Psychiatric: She has a normal mood and affect. Her behavior is normal.  Nursing note and vitals reviewed.   ED Course  Procedures (including critical care time) Labs Review Labs Reviewed  RAPID STREP SCREEN (NOT AT [facility])    Imaging Review No results found. I have personally reviewed and evaluated these images and lab results as part of my medical decision-making.   EKG Interpretation None      MDM   Final diagnoses:  Pharyngitis   Pt with sore throat, odynophagia and trismus x 3 days. Copious tonsillar exudate appreciated. Cervical adenopathy present. Cough absent. Pt with 3/4 centor criteria. Will treat as bacterial pharyngitis despite negate rapid strep. No evidence of peritonsillar abscess on exam. Uvula midline. Afebrile.  Also give home steroids and lortab. Discussed treatment plan with pt who is agreeable. Return precautions outlined in patient discharge instructions.      Lester Kinsman [place], PA-C 04/17/15 0036  Raeford Razor, MD 04/17/15 1455

## 2015-04-16 NOTE — ED Notes (Signed)
Per pt she presents d/t mouth pain and difficulty swallowing.  Pt is speaking in full sentences and has no sx of respiratory distress.  Pt was seen for the same here and rx antibiotics that she did not pick up.

## 2015-04-17 LAB — RAPID STREP SCREEN (MED CTR MEBANE ONLY): Streptococcus, Group A Screen (Direct): NEGATIVE

## 2015-04-17 MED ORDER — PREDNISONE 10 MG (21) PO TBPK: 10.0000 mg | ORAL_TABLET | Freq: Every day | ORAL | Status: DC

## 2015-04-17 MED ORDER — PENICILLIN V POTASSIUM 500 MG PO TABS: 500.0000 mg | ORAL_TABLET | Freq: Two times a day (BID) | ORAL | Status: AC

## 2015-04-17 MED ORDER — HYDROCODONE-ACETAMINOPHEN 7.5-325 MG/15ML PO SOLN: 15.0000 mL | Freq: Four times a day (QID) | ORAL | Status: AC | PRN

## 2015-04-17 NOTE — Discharge Instructions (Signed)
Pharyngitis Pharyngitis is redness, pain, and swelling (inflammation) of your pharynx.  CAUSES  Pharyngitis is usually caused by infection. Most of the time, these infections are from viruses (viral) and are part of a cold. However, sometimes pharyngitis is caused by bacteria (bacterial). Pharyngitis can also be caused by allergies. Viral pharyngitis may be spread from person to person by coughing, sneezing, and personal items or utensils (cups, forks, spoons, toothbrushes). Bacterial pharyngitis may be spread from person to person by more intimate contact, such as kissing.  SIGNS AND SYMPTOMS  Symptoms of pharyngitis include:   Sore throat.   Tiredness (fatigue).   Low-grade fever.   Headache.  Joint pain and muscle aches.  Skin rashes.  Swollen lymph nodes.  Plaque-like film on throat or tonsils (often seen with bacterial pharyngitis). DIAGNOSIS  Your health care provider will ask you questions about your illness and your symptoms. Your medical history, along with a physical exam, is often all that is needed to diagnose pharyngitis. Sometimes, a rapid strep test is done. Other lab tests may also be done, depending on the suspected cause.  TREATMENT  Viral pharyngitis will usually get better in 3-4 days without the use of medicine. Bacterial pharyngitis is treated with medicines that kill germs (antibiotics).  HOME CARE INSTRUCTIONS   Drink enough water and fluids to keep your urine clear or pale yellow.   Only take over-the-counter or prescription medicines as directed by your health care provider:   If you are prescribed antibiotics, make sure you finish them even if you start to feel better.   Do not take aspirin.   Get lots of rest.   Gargle with 8 oz of salt water ( tsp of salt per 1 qt of water) as often as every 1-2 hours to soothe your throat.   Throat lozenges (if you are not at risk for choking) or sprays may be used to soothe your throat. SEEK MEDICAL  CARE IF:   You have large, tender lumps in your neck.  You have a rash.  You cough up green, yellow-brown, or bloody spit. SEEK IMMEDIATE MEDICAL CARE IF:   Your neck becomes stiff.  You drool or are unable to swallow liquids.  You vomit or are unable to keep medicines or liquids down.  You have severe pain that does not go away with the use of recommended medicines.  You have trouble breathing (not caused by a stuffy nose). MAKE SURE YOU:   Understand these instructions.  Will watch your condition.  Will get help right away if you are not doing well or get worse.   This information is not intended to replace advice given to you by your health care provider. Make sure you discuss any questions you have with your health care provider.   Document Released: 06/25/2005 Document Revised: 04/15/2013 Document Reviewed: 03/02/2013 Elsevier Interactive Patient Education 2016 [employer]  Sore Throat A sore throat is pain, burning, irritation, or scratchiness of the throat. There is often pain or tenderness when swallowing or talking. A sore throat may be accompanied by other symptoms, such as coughing, sneezing, fever, and swollen neck glands. A sore throat is often the first sign of another sickness, such as a cold, flu, strep throat, or mononucleosis (commonly known as mono). Most sore throats go away without medical treatment. CAUSES  The most common causes of a sore throat include:  A viral infection, such as a cold, flu, or mono.  A bacterial infection, such as strep throat,  tonsillitis, or whooping cough.  Seasonal allergies.  Dryness in the air.  Irritants, such as smoke or pollution.  Gastroesophageal reflux disease (GERD). HOME CARE INSTRUCTIONS   Only take over-the-counter medicines as directed by your caregiver.  Drink enough fluids to keep your urine clear or pale yellow.  Rest as needed.  Try using throat sprays, lozenges, or sucking on hard candy to ease  any pain (if older than 4 years or as directed).  Sip warm liquids, such as broth, herbal tea, or warm water with honey to relieve pain temporarily. You may also eat or drink cold or frozen liquids such as frozen ice pops.  Gargle with salt water (mix 1 tsp salt with 8 oz of water).  Do not smoke and avoid secondhand smoke.  Put a cool-mist humidifier in your bedroom at night to moisten the air. You can also turn on a hot shower and sit in the bathroom with the door closed for 5-10 minutes. SEEK IMMEDIATE MEDICAL CARE IF:  You have difficulty breathing.  You are unable to swallow fluids, soft foods, or your saliva.  You have increased swelling in the throat.  Your sore throat does not get better in 7 days.  You have nausea and vomiting.  You have a fever or persistent symptoms for more than 2-3 days.  You have a fever and your symptoms suddenly get worse. MAKE SURE YOU:   Understand these instructions.  Will watch your condition.  Will get help right away if you are not doing well or get worse.   This information is not intended to replace advice given to you by your health care provider. Make sure you discuss any questions you have with your health care provider.   Document Released: 08/02/2004 Document Revised: 07/16/2014 Document Reviewed: 03/02/2012 Elsevier Interactive Patient Education 2016 [employer]  Strep Throat Strep throat is an infection of the throat. It is caused by germs. Strep throat spreads from person to person because of coughing, sneezing, or close contact. HOME CARE Medicines  Take over-the-counter and prescription medicines only as told by your doctor.  Take your antibiotic medicine as told by your doctor. Do not stop taking the medicine even if you feel better.  Have family members who also have a sore throat or fever go to a doctor. Eating and Drinking  Do not share food, drinking cups, or personal items.  Try eating soft foods until  your sore throat feels better.  Drink enough fluid to keep your pee (urine) clear or pale yellow. General Instructions  Rinse your mouth (gargle) with a salt-water mixture 3-4 times per day or as needed. To make a salt-water mixture, stir -1 tsp of salt into 1 cup of warm water.  Make sure that all people in your house wash their hands well.  Rest.  Stay home from school or work until you have been taking antibiotics for 24 hours.  Keep all follow-up visits as told by your doctor. This is important. GET HELP IF:  Your neck keeps getting bigger.  You get a rash, cough, or earache.  You cough up thick liquid that is green, yellow-brown, or bloody.  You have pain that does not get better with medicine.  Your problems get worse instead of getting better.  You have a fever. GET HELP RIGHT AWAY IF:  You throw up (vomit).  You get a very bad headache.  You neck hurts or it feels stiff.  You have chest pain or you are  short of breath.  You have drooling, very bad throat pain, or changes in your voice.  Your neck is swollen or the skin gets red and tender.  Your mouth is dry or you are peeing less than normal.  You keep feeling more tired or it is hard to wake up.  Your joints are red or they hurt.   This information is not intended to replace advice given to you by your health care provider. Make sure you discuss any questions you have with your health care provider.   Take antibiotics as prescribed. Return to the ED if you experience worsening of your symptoms, fever, inability to swallow or open mouth, or difficulty breathing.

## 2015-04-20 LAB — CULTURE, GROUP A STREP: Strep A Culture: NEGATIVE

## 2015-04-22 ENCOUNTER — Emergency Department (HOSPITAL_COMMUNITY)
Admission: EM | Admit: 2015-04-22 | Discharge: 2015-04-22 | Disposition: A | Discharge status: Home / Self Care | Payer: Medicaid Other | Attending: Emergency Medicine | Admitting: Emergency Medicine

## 2015-04-22 ENCOUNTER — Encounter (HOSPITAL_COMMUNITY): Payer: Self-pay | Admitting: *Deleted

## 2015-04-22 DIAGNOSIS — K130 Diseases of lips: Secondary | ICD-10-CM

## 2015-04-22 DIAGNOSIS — R600 Localized edema: Secondary | ICD-10-CM | POA: Insufficient documentation

## 2015-04-22 DIAGNOSIS — Z79899 Other long term (current) drug therapy: Secondary | ICD-10-CM | POA: Insufficient documentation

## 2015-04-22 DIAGNOSIS — R5383 Other fatigue: Secondary | ICD-10-CM | POA: Insufficient documentation

## 2015-04-22 DIAGNOSIS — K1379 Other lesions of oral mucosa: Secondary | ICD-10-CM | POA: Insufficient documentation

## 2015-04-22 DIAGNOSIS — R6884 Jaw pain: Secondary | ICD-10-CM | POA: Insufficient documentation

## 2015-04-22 DIAGNOSIS — K0889 Other specified disorders of teeth and supporting structures: Secondary | ICD-10-CM | POA: Insufficient documentation

## 2015-04-22 MED ORDER — VALACYCLOVIR HCL 1 G PO TABS
1000.0000 mg | ORAL_TABLET | Freq: Two times a day (BID) | ORAL | Status: AC
Start: 2015-04-22 — End: 2015-05-06

## 2015-04-22 MED ORDER — MAGIC MOUTHWASH W/LIDOCAINE: 5.0000 mL | Freq: Four times a day (QID) | ORAL | Status: DC | PRN

## 2015-04-22 MED ORDER — DEXAMETHASONE SODIUM PHOSPHATE 10 MG/ML IJ SOLN
10.0000 mg | Freq: Once | INTRAMUSCULAR | Status: AC
  Administered 2015-04-22: 10 mg via INTRAMUSCULAR
  Filled 2015-04-22: qty 1

## 2015-04-22 NOTE — ED Notes (Signed)
Pt complains of soreness and mucus in her throat since last week. Pt also complains of painful red bumps on her tongue and pain her left cheek while chewing. Pt went to the ED, was given RX for prednisone and penicillin. Pt states she is not feeling better

## 2015-04-22 NOTE — Discharge Instructions (Signed)
- Use magic mouthwash for symptom relief - Take valtrex twice a day for 5 days - Establish primary care with one of the options given by case management. PCP follow up if symptoms do not improve   Canker Sores Canker sores are small, painful sores that develop inside your mouth. They may also be called aphthous ulcers. You can get canker sores on the inside of your lips or cheeks, on your tongue, or anywhere inside your mouth. You can have just one canker sore or several of them. Canker sores cannot be passed from one person to another (noncontagious). These sores are different than the sores that you may get on the outside of your lips (cold sores or fever blisters). Canker sores usually start as painful red bumps. Then they turn into small white, yellow, or gray ulcers that have red borders. The ulcers may be quite painful. The pain may be worse when you eat or drink. CAUSES The cause of this condition is not known. RISK FACTORS This condition is more likely to develop in:  Women.  People in their teens or 67s.  Women who are having their menstrual period.  People who are under a lot of emotional stress.  People who do not get enough iron or B vitamins.  People who have poor oral hygiene.  People who have an injury inside the mouth. This can happen after having dental work or from chewing something hard. SYMPTOMS Along with the canker sore, symptoms may also include:  Fever.  Fatigue.  Swollen lymph nodes in your neck. DIAGNOSIS This condition can be diagnosed based on your symptoms. Your health care provider will also examine your mouth. Your health care provider may also do tests if you get canker sores often or if they are very bad. Tests may include:  Blood tests to rule out other causes of canker sores.  Taking swabs from the sore to check for infection.  Taking a small piece of skin from the sore (biopsy) to test it for cancer. TREATMENT Most canker sores clear up  without treatment in about 10 days. Home care is usually the only treatment that you will need. Over-the-counter medicines can relieve discomfort.If you have severe canker sores, your health care provider may prescribe:  Numbing ointment to relieve pain.  Vitamins.  Steroid medicines. These may be given as:  Oral pills.  Mouth rinses.  Gels.  Antibiotic mouth rinse. HOME CARE INSTRUCTIONS  Apply, take, or use medicines only as directed by your health care provider. These include vitamins.  If you were prescribed an antibiotic mouth rinse, finish all of it even if you start to feel better.  Until the sores are healed:  Do not drink coffee or citrus juices.  Do not eat spicy or salty foods.  Use a mild, over-the-counter mouth rinse as directed by your health care provider.  Practice good oral hygiene.  Floss your teeth every day.  Brush your teeth with a soft brush twice each day. SEEK MEDICAL CARE IF:  Your symptoms do not get better after two weeks.  You also have a fever or swollen glands.  You get canker sores often.  You have a canker sore that is getting larger.  You cannot eat or drink due to your canker sores.   This information is not intended to replace advice given to you by your health care provider. Make sure you discuss any questions you have with your health care provider.   Document Released: 10/20/2010 Document  Revised: 11/09/2014 Document Reviewed: 05/26/2014 Elsevier Interactive Patient Education [employer]2016 [employer].

## 2015-04-22 NOTE — ED Provider Notes (Signed)
CSN: [id number]     Arrival date & time 04/22/15  1523 History  By signing my name below, I, Tanda RockersMargaux Venter, attest that this documentation has been prepared under the direction and in the presence of Anjalina Bergevin, PA-C. Electronically Signed: Tanda RockersMargaux Venter, ED Scribe. 04/22/2015. 4:09 PM.  Chief Complaint  Patient presents with  . Sore Throat  . Dental Pain   The history is provided by the patient. No language interpreter was used.     HPI Comments: Netta NeatStephanie Yano is a 24 y.o. female who presents to the Emergency Department complaining of gradual onset, constant, mouth pain x 1.5 weeks. The pain is mostly localized to the roof of her mouth, buccal mucosa, lips and and left jaw and is exacerbated with eating, drinking and chewing. Pt states that her mouth feels like it is burning. Pt was seen in the ED on 04/13/2015 for left lower lip swelling and difficulty speaking and eating due to pain from left lower lip. Pt had a lip piercing recently and admitted to not cleaning the area very well and believed there was an infection to the area. She was prescribed antibiotics at that time but did not get it filled because she did not have time. Pt was seen again on 04/16/2015 for same symptoms as well as new onset sore throat. At that time she was prescribed Penicillin and Prednisone. Pt has been taking the medication without relief. She is also complaining of red dots on her tongue, white patches on her gums with bleeding, lip swelling, and scabs on her lips. She also reports feeling very fatigued and is having difficulty speaking from the pain. Denies difficulty breathing, difficulty handling secretions or inability to swallow liquids. Pt states she has been drinking ensure instead of solids because it is less painful. Pt mentions that she works with [employer] and is exposed to sick animals and people frequently. She denies fever, chills, nausea, vomiting, or any other associated symptoms.   History  reviewed. No pertinent past medical history. History reviewed. No pertinent past surgical history. Family History  Problem Relation Age of Onset  . Cancer Mother   . Cancer Father    Social History  Substance Use Topics  . Smoking status: Never Smoker   . Smokeless tobacco: None  . Alcohol Use: 2.4 oz/week    4 Cans of beer per week   OB History    Gravida Para Term Preterm AB TAB SAB Ectopic Multiple Living   1    1  1         Review of Systems  Constitutional: Positive for fatigue. Negative for fever and chills.  HENT: Positive for mouth sores, sore throat and trouble swallowing. Negative for congestion, drooling, rhinorrhea and voice change.        + Roof of mouth pain + Jaw pain + Red dots to tongue + Gum bleeding + Bumps on lips  + Lip swelling  Respiratory: Negative for shortness of breath.   Gastrointestinal: Negative for nausea, vomiting and abdominal pain.  Genitourinary: Negative for dysuria and pelvic pain.  Musculoskeletal: Negative for myalgias and arthralgias.   Allergies  Review of patient's allergies indicates no known allergies.  Home Medications   Prior to Admission medications   Medication Sig Start Date End Date Taking? Authorizing Provider  cephALEXin (KEFLEX) 500 MG capsule Take 1 capsule (500 mg total) by mouth 4 (four) times daily. Patient not taking: Reported on 04/16/2015 04/14/15   Derwood KaplanAnkit Nanavati, MD  diphenhydrAMINE (BENADRYL)  25 mg capsule Take 25 mg by mouth every 6 (six) hours as needed for allergies.    Historical Provider, MD  HYDROcodone-acetaminophen (HYCET) 7.5-325 mg/15 ml solution Take 15 mLs by mouth 4 (four) times daily as needed for moderate pain. 04/17/15 04/16/16  Samantha Tripp Dowless, PA-C  ibuprofen (ADVIL,MOTRIN) 200 MG tablet Take 600 mg by mouth every 6 (six) hours as needed for moderate pain.     Historical Provider, MD  magic mouthwash w/lidocaine SOLN Take 5 mLs by mouth 4 (four) times daily as needed for mouth pain.  04/22/15   Anja Neuzil, PA-C  metroNIDAZOLE (FLAGYL) 500 MG tablet Take 1 tablet (500 mg total) by mouth 2 (two) times daily. Patient not taking: Reported on 01/20/2015 02/03/14   Samuel Jester, DO  Multiple Vitamin (MULTIVITAMIN WITH MINERALS) TABS tablet Take 1 tablet by mouth daily.    Historical Provider, MD  Omega-3 Fatty Acids (FISH OIL PO) Take 1 capsule by mouth daily.    Historical Provider, MD  penicillin v potassium (VEETID) 500 MG tablet Take 1 tablet (500 mg total) by mouth 2 (two) times daily. 04/17/15 04/24/15  Samantha Tripp Dowless, PA-C  predniSONE (STERAPRED UNI-PAK 21 TAB) 10 MG (21) TBPK tablet Take 1 tablet (10 mg total) by mouth daily. Take 5 tabs by mouth daily  for 2 days, then 4 tabs for 2 days, then 3 tabs for 2 days, then 2 tabs for 2 days, 1 tabs for 2 days, 04/17/15   Samantha Tripp Dowless, PA-C  valACYclovir (VALTREX) 1000 MG tablet Take 1 tablet (1,000 mg total) by mouth 2 (two) times daily. 04/22/15 05/06/15  Marletta Bousquet, PA-C   Triage Vitals: BP 134/78 mmHg  Pulse 92  Temp(Src) 98.3 F (36.8 C) (Oral)  Resp 18  SpO2 99%  LMP 04/12/2015 (Approximate)   Physical Exam  Constitutional: She appears well-developed and well-nourished. No distress.  HENT:  Head: Normocephalic and atraumatic.  Right Ear: External ear normal.  Left Ear: External ear normal.  Mouth/Throat: Uvula is midline and mucous membranes are normal. Oral lesions present. No uvula swelling. No oropharyngeal exudate, posterior oropharyngeal edema, posterior oropharyngeal erythema or tonsillar abscesses.  Multiple vesicular lesions over lips and mouth. Some lesions scabbed over. Anterior tongue with few small pink bumps without vesicle formation. No other lesions or plaques noted to buccal mucosa. Normal dentition. No oropharyngeal edema, erythema or exudate. Uvula midline. No cervical adenopathy. Tenderness over left TMJ.   Eyes: Conjunctivae are normal. Right eye exhibits no discharge. Left  eye exhibits no discharge. No scleral icterus.  Neck: Normal range of motion. Neck supple.  Cardiovascular: Normal rate, regular rhythm and normal heart sounds.   Pulmonary/Chest: Effort normal and breath sounds normal. No stridor. No respiratory distress. She has no wheezes. She has no rales.  Breathing unlabored. Lungs CTAB  Musculoskeletal: Normal range of motion.  Moves all extremities spontaneously and walks with a steady gait  Lymphadenopathy:    She has no cervical adenopathy.  Neurological: She is alert. Coordination normal.  Skin: Skin is warm and dry.  Psychiatric: She has a normal mood and affect. Her behavior is normal.  Nursing note and vitals reviewed.   ED Course  Procedures (including critical care time)  DIAGNOSTIC STUDIES: Oxygen Saturation is 99% on RA, normal by my interpretation.    COORDINATION OF CARE: 4:09 PM-Discussed treatment plan with pt at bedside and pt agreed to plan.   Labs Review Labs Reviewed - No data to display  Imaging Review No  results found.   EKG Interpretation None      MDM   Final diagnoses:  Oral pain  Lesion of lip   Pt afebrile without tonsillar exudate. Presents with vesicular lesions of lips and oral pain. 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 Pt able to drink fluids, handle secretions and has no difficulty breathing. Presentation not concerning for PTA or infection of neck soft tissues. 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 Pt encouraged to establish primary care for follow up. Case management has seen the pt. At this time there does not appear to be any evidence of an acute emergency medical condition and the patient appears stable for discharge with appropriate outpatient follow up.Diagnosis was discussed with patient who verbalizes understanding and is agreeable to discharge. Pt case discussed with Dr. Freida Busman  who agrees with my plan. Pt stable for discharge.   I personally performed the services described in this documentation, which was scribed in my presence. The recorded information has been reviewed and is accurate.   Alveta Heimlich, PA-C 04/22/15 1742  Lorre Nick, MD 04/24/15 2351

## 2015-04-22 NOTE — Progress Notes (Signed)
CM spoke with [place] who [place].  CM discussed and provided written information for uninsured accepting pcps, discussed the importance of pcp vs EDP services for f/u care, www.needymeds.org, www.goodrx.com, discounted pharmacies and other [place] resources such as [facility] , [facility], affordable care act, financial assistance, uninsured dental services, [place] med assist, DSS and  health department  Reviewed resources for [place] uninsured accepting pcps like [facility], [facility] medicine [facility] [address], [facility], [facility], local urgent care centers, [facility], [facility], general medical clinics, [facility] services of the [place], [facility] plus others, medication resources, [facility] out patient pharmacies and housing Pt voiced understanding and appreciation of resources provided   Provided [facility] contact information Pt has medicaid family planning  Encouraged use of goodrx.com, and uninsured pcps for follow up care

## 2016-05-16 ENCOUNTER — Emergency Department (HOSPITAL_COMMUNITY)
Admission: EM | Admit: 2016-05-16 | Discharge: 2016-05-16 | Disposition: A | Discharge status: Home / Self Care | Payer: Medicaid Other | Attending: Emergency Medicine | Admitting: Emergency Medicine

## 2016-05-16 ENCOUNTER — Encounter (HOSPITAL_COMMUNITY): Payer: Self-pay

## 2016-05-16 DIAGNOSIS — R5383 Other fatigue: Secondary | ICD-10-CM

## 2016-05-16 DIAGNOSIS — Z79899 Other long term (current) drug therapy: Secondary | ICD-10-CM | POA: Insufficient documentation

## 2016-05-16 DIAGNOSIS — N3000 Acute cystitis without hematuria: Secondary | ICD-10-CM

## 2016-05-16 DIAGNOSIS — E860 Dehydration: Secondary | ICD-10-CM | POA: Insufficient documentation

## 2016-05-16 DIAGNOSIS — F509 Eating disorder, unspecified: Secondary | ICD-10-CM | POA: Insufficient documentation

## 2016-05-16 HISTORY — DX: Eating disorder, unspecified: F50.9

## 2016-05-16 LAB — COMPREHENSIVE METABOLIC PANEL
ALT: 15 U/L (ref 14–54)
AST: 19 U/L (ref 15–41)
Albumin: 4.1 g/dL (ref 3.5–5.0)
Alkaline Phosphatase: 48 U/L (ref 38–126)
Anion gap: 8 (ref 5–15)
BUN: 10 mg/dL (ref 6–20)
CHLORIDE: 105 mmol/L (ref 101–111)
CO2: 24 mmol/L (ref 22–32)
CREATININE: 0.72 mg/dL (ref 0.44–1.00)
Calcium: 9.2 mg/dL (ref 8.9–10.3)
GFR calc non Af Amer: >60 mL/min (ref >60)
Glucose, Bld: 164 mg/dL — ABNORMAL HIGH (ref 65–99)
Potassium: 3.8 mmol/L (ref 3.5–5.1)
SODIUM: 137 mmol/L (ref 135–145)
Total Bilirubin: 0.4 mg/dL (ref 0.3–1.2)
Total Protein: 6.7 g/dL (ref 6.5–8.1)

## 2016-05-16 LAB — URINALYSIS, ROUTINE W REFLEX MICROSCOPIC
Bilirubin Urine: NEGATIVE
GLUCOSE, UA: NEGATIVE mg/dL
HGB URINE DIPSTICK: NEGATIVE
Ketones, ur: 15 mg/dL — AB
Nitrite: NEGATIVE
PH: 7.5 (ref 5.0–8.0)
PROTEIN: NEGATIVE mg/dL
SPECIFIC GRAVITY, URINE: 1.011 (ref 1.005–1.030)

## 2016-05-16 LAB — CBC
HEMATOCRIT: 38.5 % (ref 36.0–46.0)
Hemoglobin: 12.9 g/dL (ref 12.0–15.0)
MCH: 31.3 pg (ref 26.0–34.0)
MCHC: 33.5 g/dL (ref 30.0–36.0)
MCV: 93.4 fL (ref 78.0–100.0)
PLATELETS: 206 10*3/uL (ref 150–400)
RBC: 4.12 MIL/uL (ref 3.87–5.11)
RDW: 12 % (ref 11.5–15.5)
WBC: 6.2 10*3/uL (ref 4.0–10.5)

## 2016-05-16 LAB — URINE MICROSCOPIC-ADD ON

## 2016-05-16 LAB — LIPASE, BLOOD: LIPASE: 24 U/L (ref 11–51)

## 2016-05-16 LAB — I-STAT BETA HCG BLOOD, ED (MC, WL, AP ONLY): I-stat hCG, quantitative: <5 m[IU]/mL (ref <5)

## 2016-05-16 MED ORDER — CEPHALEXIN 500 MG PO CAPS: 500.0000 mg | ORAL_CAPSULE | Freq: Two times a day (BID) | ORAL | 0 refills | Status: AC

## 2016-05-16 MED ORDER — DIPHENHYDRAMINE HCL 50 MG/ML IJ SOLN
25.0000 mg | Freq: Once | INTRAMUSCULAR | Status: AC
  Administered 2016-05-16: 25 mg via INTRAVENOUS
  Filled 2016-05-16: qty 1

## 2016-05-16 MED ORDER — SODIUM CHLORIDE 0.9 % IV BOLUS (SEPSIS)
1000.0000 mL | Freq: Once | INTRAVENOUS | Status: AC
  Administered 2016-05-16: 1000 mL via INTRAVENOUS

## 2016-05-16 MED ORDER — CEPHALEXIN 250 MG PO CAPS
500.0000 mg | ORAL_CAPSULE | Freq: Once | ORAL | Status: AC
  Administered 2016-05-16: 500 mg via ORAL
  Filled 2016-05-16: qty 2

## 2016-05-16 MED ORDER — CEPHALEXIN 250 MG/5ML PO SUSR
500.0000 mg | Freq: Three times a day (TID) | ORAL | 0 refills | Status: AC
Start: 2016-05-16 — End: 2016-05-21

## 2016-05-16 MED ORDER — CEPHALEXIN 500 MG PO CAPS: 500.0000 mg | ORAL_CAPSULE | Freq: Three times a day (TID) | ORAL | 0 refills | Status: AC

## 2016-05-16 MED ORDER — METOCLOPRAMIDE HCL 5 MG/ML IJ SOLN
10.0000 mg | Freq: Once | INTRAMUSCULAR | Status: AC
  Administered 2016-05-16: 10 mg via INTRAVENOUS
  Filled 2016-05-16: qty 2

## 2016-05-16 NOTE — ED Triage Notes (Signed)
Patient complains of weakness, chills, fatigue for the past 2 days. States that she has eating disorder and has no appetite and often forgets to eat. States that she is seeing MD in [place] to assist with her disorder. Complains of vague headache for 2 days. NAD. Alert and oriented

## 2016-05-16 NOTE — Discharge Instructions (Signed)
Please ask your therapist to refer you to an eating disorders specialist to help with your treatment.   The number ofr the [facility] is [phone]

## 2016-05-16 NOTE — ED Provider Notes (Signed)
[facility] DEPT Provider Note   CSN: [id number] Arrival date & time: 05/16/16  1453     History   Chief Complaint Chief Complaint  Patient presents with  . Fatigue-minimal intake  . Chills    HPI Diane Wood is a [age] y.o. female.  HPI 25 year old female with past medical history of eating disorder in which she forgets to eat for up to several days at a time who presents with general fatigue. The patient states that she has seen multiple surface for this and believes it is secondary to stressors in her previous childhood. She currently lives alone and is going to school, working up to 80 hours a week. She states that over the last day or 2, she has forgotten multiple meals and says  she feels generally fatigued and dehydrated. She has mild dizziness upon standing. She also notes general malaise and increased sleeping. Denies any specific complaints. She does note increased urinary frequency despite being dehydrated. No fevers or chills. No flank pain. Denies any SI, HI, or auditory or visual hallucinations.  Past Medical History:  Diagnosis Date  . Eating disorder     There are no active problems to display for this patient.   History reviewed. No pertinent surgical history.  OB History    Gravida Para Term Preterm AB Living   1       1     SAB TAB Ectopic Multiple Live Births   1               Home Medications    Prior to Admission medications   Medication Sig Start Date End Date Taking? Authorizing Provider  diphenhydrAMINE (BENADRYL) 25 mg capsule Take 25 mg by mouth every 6 (six) hours as needed for allergies.   Yes Historical Provider, MD  folic acid (FOLVITE) 1 MG tablet Take 1 mg by mouth daily.   Yes Historical Provider, MD  ibuprofen (ADVIL,MOTRIN) 200 MG tablet Take 600 mg by mouth every 6 (six) hours as needed for moderate pain.    Yes Historical Provider, MD  Multiple Vitamin (MULTIVITAMIN WITH MINERALS) TABS tablet Take 1 tablet by mouth daily.    Yes Historical Provider, MD  Omega-3 Fatty Acids (FISH OIL PO) Take 1 capsule by mouth daily.   Yes Historical Provider, MD  cephALEXin (KEFLEX) 250 MG/5ML suspension Take 10 mLs (500 mg total) by mouth 3 (three) times daily. 05/16/16 05/21/16  Shaune Pollackameron Dezzie Badilla, MD  cephALEXin (KEFLEX) 500 MG capsule Take 1 capsule (500 mg total) by mouth 2 (two) times daily. 05/16/16 05/21/16  Shaune Pollackameron Lonisha Bobby, MD  cephALEXin (KEFLEX) 500 MG capsule Take 1 capsule (500 mg total) by mouth 3 (three) times daily. 05/16/16 05/21/16  Shaune Pollackameron Latonya Knight, MD  magic mouthwash w/lidocaine SOLN Take 5 mLs by mouth 4 (four) times daily as needed for mouth pain. Patient not taking: Reported on 05/16/2016 04/22/15   Stevi Barrett, PA-C  metroNIDAZOLE (FLAGYL) 500 MG tablet Take 1 tablet (500 mg total) by mouth 2 (two) times daily. Patient not taking: Reported on 05/16/2016 02/03/14   Samuel JesterKathleen McManus, DO  predniSONE (STERAPRED UNI-PAK 21 TAB) 10 MG (21) TBPK tablet Take 1 tablet (10 mg total) by mouth daily. Take 5 tabs by mouth daily  for 2 days, then 4 tabs for 2 days, then 3 tabs for 2 days, then 2 tabs for 2 days, 1 tabs for 2 days, Patient not taking: Reported on 05/16/2016 04/17/15   Dub MikesSamantha Tripp Dowless, PA-C    Family History Family  History  Problem Relation Age of Onset  . Cancer Mother   . Cancer Father     Social History Social History  Substance Use Topics  . Smoking status: Never Smoker  . Smokeless tobacco: Not on file  . Alcohol use 2.4 oz/week    4 Cans of beer per week     Allergies   Patient has no known allergies.   Review of Systems Review of Systems  Constitutional: Positive for fatigue. Negative for chills and fever.  HENT: Negative for congestion, rhinorrhea and sore throat.   Eyes: Negative for visual disturbance.  Respiratory: Negative for cough, shortness of breath and wheezing.   Cardiovascular: Negative for chest pain and leg swelling.  Gastrointestinal: Negative for abdominal pain,  diarrhea, nausea and vomiting.  Genitourinary: Positive for frequency. Negative for dysuria, flank pain, vaginal bleeding and vaginal discharge.  Musculoskeletal: Negative for neck pain and neck stiffness.  Skin: Negative for pallor and rash.  Allergic/Immunologic: Negative for immunocompromised state.  Neurological: Positive for weakness (Generalized). Negative for syncope and headaches.  Hematological: Does not bruise/bleed easily.  All other systems reviewed and are negative.    Physical Exam Updated Vital Signs BP 102/56 (BP Location: Right Arm)   Pulse 93   Temp 98.3 F (36.8 C) (Oral)   Resp 16   Ht 5\' 4"  (1.626 m)   Wt 129 lb 3 oz (58.6 kg)   SpO2 100%   BMI 22.17 kg/m   Physical Exam  Constitutional: She is oriented to person, place, and time. She appears well-developed and well-nourished. No distress.  HENT:  Head: Normocephalic and atraumatic.  Mildly dry mucous membranes  Eyes: Conjunctivae are normal.  Neck: Neck supple.  Cardiovascular: Normal rate, regular rhythm and normal heart sounds.  Exam reveals no friction rub.   No murmur heard. Pulmonary/Chest: Effort normal and breath sounds normal. No respiratory distress. She has no wheezes. She has no rales.  Abdominal: She exhibits no distension.  Musculoskeletal: She exhibits no edema.  Neurological: She is alert and oriented to person, place, and time. She exhibits normal muscle tone.  Skin: Skin is warm. Capillary refill takes less than 2 seconds.  Psychiatric: She has a normal mood and affect.  Nursing note and vitals reviewed.    ED Treatments / Results  Labs (all labs ordered are listed, but only abnormal results are displayed) Labs Reviewed  COMPREHENSIVE METABOLIC PANEL - Abnormal; Notable for the following:       Result Value   Glucose, Bld 164 (*)    All other components within normal limits  URINALYSIS, ROUTINE W REFLEX MICROSCOPIC (NOT AT [facility]) - Abnormal; Notable for the following:     APPearance CLOUDY (*)    Ketones, ur 15 (*)    Leukocytes, UA TRACE (*)    All other components within normal limits  URINE MICROSCOPIC-ADD ON - Abnormal; Notable for the following:    Squamous Epithelial / LPF 6-30 (*)    Bacteria, UA MANY (*)    Casts GRANULAR CAST (*)    All other components within normal limits  LIPASE, BLOOD  CBC  I-STAT BETA HCG BLOOD, ED ([facility], [facility], [facility] ONLY)    EKG  EKG Interpretation None       Radiology No results found.  Procedures Procedures (including critical care time)  Medications Ordered in ED Medications  sodium chloride 0.9 % bolus 1,000 mL (0 mLs Intravenous Stopped 05/16/16 1910)  metoCLOPramide (REGLAN) injection 10 mg (10 mg Intravenous Given 05/16/16  1739)  diphenhydrAMINE (BENADRYL) injection 25 mg (25 mg Intravenous Given 05/16/16 1739)  cephALEXin (KEFLEX) capsule 500 mg (500 mg Oral Given 05/16/16 1925)     Initial Impression / Assessment and Plan / ED Course  I have reviewed the triage vital signs and the nursing notes.  Pertinent labs & imaging results that were available during my care of the patient were reviewed by me and considered in my medical decision making (see chart for details).  Clinical Course     25 year old female with past medical history as above who presents with general fatigue, dizziness upon standing, and malaise. Patient has history of complicated eating disorder, with prolonged periods of little to no by mouth intake. Regarding her generalized weakness, she does appear mildly dehydrated. Screening lab work shows mild ketonuria but is otherwise unremarkable without evidence to suggest significant dehydration. CBC shows normal white count. Urinalysis does show mild pyuria and she endorses urinary frequency. Will treat for UTI. No signs of complicated UTI and patient has no fever, vomiting, flank pain, or signs of pyelonephritis. Will give IV fluids for dehydration and reassess.   Patient tolerated by mouth  without difficulty. She feels markedly improved after IV fluids. Vital signs and stable. I suspect her symptoms are due to recent decreased by mouth intake in setting of her chronic eating disorder. She has no SI, HI, or auditory or visual hallucinations. She does not meet IVC criteria. I discussed my concern about her eating disorder and [facility] as well as advised her to follow up with her therapist for this. Otherwise, will treat for UTI and discharged home   Final Clinical Impressions(s) / ED Diagnoses   Final diagnoses:  Other fatigue  Dehydration  Acute cystitis without hematuria  Atypical eating disorder    New Prescriptions Discharge Medication List as of 05/16/2016  7:22 PM       Shaune Pollackameron Lilliauna Van, MD 05/17/16 1136

## 2016-05-16 NOTE — ED Notes (Signed)
Pt tolerated water

## 2018-08-04 ENCOUNTER — Ambulatory Visit
Admission: RE | Admit: 2018-08-04 | Discharge: 2018-08-04 | Disposition: A | Discharge status: Home / Self Care | Payer: BLUE CROSS/BLUE SHIELD | Source: Ambulatory Visit | Attending: Family Medicine | Admitting: Family Medicine

## 2018-08-04 ENCOUNTER — Other Ambulatory Visit: Payer: Self-pay | Admitting: Family Medicine

## 2018-08-04 DIAGNOSIS — M25552 Pain in left hip: Secondary | ICD-10-CM

## 2018-08-05 ENCOUNTER — Other Ambulatory Visit: Payer: Self-pay | Admitting: Family Medicine

## 2018-08-05 DIAGNOSIS — R1030 Lower abdominal pain, unspecified: Secondary | ICD-10-CM

## 2018-08-07 ENCOUNTER — Ambulatory Visit
Admission: RE | Admit: 2018-08-07 | Discharge: 2018-08-07 | Disposition: A | Discharge status: Home / Self Care | Payer: BLUE CROSS/BLUE SHIELD | Source: Ambulatory Visit | Attending: Family Medicine | Admitting: Family Medicine

## 2018-08-07 DIAGNOSIS — R1030 Lower abdominal pain, unspecified: Secondary | ICD-10-CM

## 2018-08-12 ENCOUNTER — Other Ambulatory Visit: Payer: Self-pay | Admitting: Family Medicine

## 2018-08-12 DIAGNOSIS — R1030 Lower abdominal pain, unspecified: Secondary | ICD-10-CM

## 2018-08-13 ENCOUNTER — Other Ambulatory Visit: Payer: Self-pay

## 2018-08-13 ENCOUNTER — Encounter (HOSPITAL_COMMUNITY): Payer: Self-pay | Admitting: *Deleted

## 2018-08-13 ENCOUNTER — Emergency Department (HOSPITAL_COMMUNITY)
Admission: EM | Admit: 2018-08-13 | Discharge: 2018-08-13 | Disposition: A | Discharge status: Home / Self Care | Payer: BLUE CROSS/BLUE SHIELD | Attending: Emergency Medicine | Admitting: Emergency Medicine

## 2018-08-13 ENCOUNTER — Emergency Department (HOSPITAL_COMMUNITY): Payer: BLUE CROSS/BLUE SHIELD

## 2018-08-13 DIAGNOSIS — N83292 Other ovarian cyst, left side: Secondary | ICD-10-CM | POA: Insufficient documentation

## 2018-08-13 DIAGNOSIS — R109 Unspecified abdominal pain: Secondary | ICD-10-CM | POA: Diagnosis present

## 2018-08-13 DIAGNOSIS — N83202 Unspecified ovarian cyst, left side: Secondary | ICD-10-CM

## 2018-08-13 DIAGNOSIS — T7421XA Adult sexual abuse, confirmed, initial encounter: Secondary | ICD-10-CM | POA: Insufficient documentation

## 2018-08-13 HISTORY — DX: Avoidant/restrictive food intake disorder: F50.82

## 2018-08-13 HISTORY — DX: Adult sexual abuse, confirmed, initial encounter: T74.21XA

## 2018-08-13 LAB — CBC WITH DIFFERENTIAL/PLATELET
Abs Immature Granulocytes: 0.04 10*3/uL (ref 0.00–0.07)
Basophils Absolute: 0 10*3/uL (ref 0.0–0.1)
Basophils Relative: 0 %
Eosinophils Absolute: 0.1 10*3/uL (ref 0.0–0.5)
Eosinophils Relative: 1 %
HCT: 37.4 % (ref 36.0–46.0)
Hemoglobin: 12.8 g/dL (ref 12.0–15.0)
Immature Granulocytes: 1 %
Lymphocytes Relative: 33 %
Lymphs Abs: 1.9 10*3/uL (ref 0.7–4.0)
MCH: 31.9 pg (ref 26.0–34.0)
MCHC: 34.2 g/dL (ref 30.0–36.0)
MCV: 93.3 fL (ref 80.0–100.0)
MONOS PCT: 11 %
Monocytes Absolute: 0.6 10*3/uL (ref 0.1–1.0)
Neutro Abs: 3.3 10*3/uL (ref 1.7–7.7)
Neutrophils Relative %: 54 %
Platelets: 213 10*3/uL (ref 150–400)
RBC: 4.01 MIL/uL (ref 3.87–5.11)
RDW: 11.6 % (ref 11.5–15.5)
WBC: 6 10*3/uL (ref 4.0–10.5)
nRBC: 0 % (ref 0.0–0.2)

## 2018-08-13 LAB — COMPREHENSIVE METABOLIC PANEL
ALT: 12 U/L (ref 0–44)
AST: 17 U/L (ref 15–41)
Albumin: 4 g/dL (ref 3.5–5.0)
Alkaline Phosphatase: 43 U/L (ref 38–126)
Anion gap: 9 (ref 5–15)
BUN: 8 mg/dL (ref 6–20)
CO2: 22 mmol/L (ref 22–32)
Calcium: 9.2 mg/dL (ref 8.9–10.3)
Chloride: 108 mmol/L (ref 98–111)
Creatinine, Ser: 0.82 mg/dL (ref 0.44–1.00)
GFR calc Af Amer: >60 mL/min (ref >60)
Glucose, Bld: 82 mg/dL (ref 70–99)
Potassium: 3.9 mmol/L (ref 3.5–5.1)
Sodium: 139 mmol/L (ref 135–145)
Total Bilirubin: 0.6 mg/dL (ref 0.3–1.2)
Total Protein: 6.9 g/dL (ref 6.5–8.1)

## 2018-08-13 LAB — URINALYSIS, ROUTINE W REFLEX MICROSCOPIC
BILIRUBIN URINE: NEGATIVE
Glucose, UA: NEGATIVE mg/dL
Hgb urine dipstick: NEGATIVE
Ketones, ur: NEGATIVE mg/dL
Leukocytes, UA: NEGATIVE
Nitrite: NEGATIVE
Protein, ur: NEGATIVE mg/dL
SPECIFIC GRAVITY, URINE: 1.002 — AB (ref 1.005–1.030)
pH: 6 (ref 5.0–8.0)

## 2018-08-13 LAB — PREGNANCY, URINE: Preg Test, Ur: NEGATIVE

## 2018-08-13 LAB — WET PREP, GENITAL
Sperm: NONE SEEN
Trich, Wet Prep: NONE SEEN
Yeast Wet Prep HPF POC: NONE SEEN

## 2018-08-13 LAB — LIPASE, BLOOD: LIPASE: 25 U/L (ref 11–51)

## 2018-08-13 MED ORDER — MORPHINE SULFATE (PF) 4 MG/ML IV SOLN
4.0000 mg | Freq: Once | INTRAVENOUS | Status: AC
  Administered 2018-08-13: 4 mg via INTRAVENOUS
  Filled 2018-08-13: qty 1

## 2018-08-13 MED ORDER — IOHEXOL 300 MG/ML  SOLN
100.0000 mL | Freq: Once | INTRAMUSCULAR | Status: AC | PRN
  Administered 2018-08-13: 100 mL via INTRAVENOUS

## 2018-08-13 MED ORDER — METRONIDAZOLE 500 MG PO TABS: 500.0000 mg | ORAL_TABLET | Freq: Two times a day (BID) | ORAL | 0 refills | Status: AC

## 2018-08-13 MED ORDER — SODIUM CHLORIDE 0.9 % IV BOLUS
1000.0000 mL | Freq: Once | INTRAVENOUS | Status: AC
  Administered 2018-08-13: 1000 mL via INTRAVENOUS

## 2018-08-13 MED ORDER — ONDANSETRON HCL 4 MG/2ML IJ SOLN
4.0000 mg | Freq: Once | INTRAMUSCULAR | Status: AC
  Administered 2018-08-13: 4 mg via INTRAVENOUS
  Filled 2018-08-13: qty 2

## 2018-08-13 MED ORDER — HYDROCODONE-ACETAMINOPHEN 5-325 MG PO TABS: 1.0000 | ORAL_TABLET | ORAL | 0 refills | Status: DC | PRN

## 2018-08-13 MED ORDER — HYDROCODONE-ACETAMINOPHEN 5-325 MG PO TABS: 1.0000 | ORAL_TABLET | Freq: Four times a day (QID) | ORAL | 0 refills | Status: DC | PRN

## 2018-08-13 MED ORDER — NAPROXEN 500 MG PO TABS: 500.0000 mg | ORAL_TABLET | Freq: Two times a day (BID) | ORAL | 0 refills | Status: DC

## 2018-08-13 MED ORDER — HYDROMORPHONE HCL 1 MG/ML IJ SOLN
0.5000 mg | Freq: Once | INTRAMUSCULAR | Status: AC
  Administered 2018-08-13: 0.5 mg via INTRAVENOUS
  Filled 2018-08-13: qty 1

## 2018-08-13 NOTE — ED Triage Notes (Signed)
PT reports Lt ABD pain  For 2 months . Pt has a PCP and Has been seen by PCP . Pt woke up at [facility]  With pain. . Pt only on Lt side of ABD . Pt is waiting a non emergant  CT.

## 2018-08-13 NOTE — Discharge Instructions (Signed)
Follow-up with your primary care doctor.  Consider seeing a GI doctor and gynecologist for further evaluation of this persistent abdominal pain

## 2018-08-13 NOTE — ED Notes (Signed)
Patient transported to [facility] 
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
## 2018-08-13 NOTE — ED Provider Notes (Signed)
Pelvic exam: normal external genitalia, vulva, vagina, cervix, uterus and adnexa. Exam chaperoned     Arthor Captain, PA-C 08/13/18 1328    Pricilla Loveless, MD 08/13/18 1520

## 2018-08-14 LAB — HIV ANTIBODY (ROUTINE TESTING W REFLEX): HIV Screen 4th Generation wRfx: NONREACTIVE

## 2018-08-14 LAB — RPR: RPR Ser Ql: NONREACTIVE

## 2018-08-14 LAB — GC/CHLAMYDIA PROBE AMP (~~LOC~~) NOT AT ARMC
CHLAMYDIA, DNA PROBE: NEGATIVE
NEISSERIA GONORRHEA: NEGATIVE

## 2018-08-15 ENCOUNTER — Other Ambulatory Visit: Payer: BLUE CROSS/BLUE SHIELD

## 2018-11-20 ENCOUNTER — Telehealth: Payer: Self-pay | Admitting: Gastroenterology

## 2018-11-20 NOTE — Telephone Encounter (Signed)
Records reviewed. Seen by Dr. Marca Ancona at [facility] in 08/2018. Requesting 2nd opinion/new GI. Ok to schedule OV with me (virtual ok). Thanks.

## 2018-11-20 NOTE — Telephone Encounter (Signed)
DOD 11/19/18 Dr Barron Alvine, There is a referral for this pt for recurrent upper and lower left-side abd p.  Pt would like to transfer care because she feels that previous GI MD was dismissive of her symptoms and pt later ended up in the ED.    Pt stated that she has abd pain with a physical trigger whenever she bends over.  She reported that she has difficulty breathing, exhaustion and feels light headed when it occurs.    Pt has GI hx at [facility].  Records will be sent to you for review.

## 2018-11-20 NOTE — Telephone Encounter (Signed)
Please see below.  Pt requested to transfer care not second opinion.

## 2018-11-25 ENCOUNTER — Telehealth (INDEPENDENT_AMBULATORY_CARE_PROVIDER_SITE_OTHER): Payer: BLUE CROSS/BLUE SHIELD | Admitting: Gastroenterology

## 2018-11-25 ENCOUNTER — Encounter: Payer: Self-pay | Admitting: Gastroenterology

## 2018-11-25 ENCOUNTER — Other Ambulatory Visit: Payer: Self-pay

## 2018-11-25 VITALS — Ht 63.5 in | Wt 139.0 lb

## 2018-11-25 DIAGNOSIS — R1012 Left upper quadrant pain: Secondary | ICD-10-CM | POA: Diagnosis not present

## 2018-11-25 DIAGNOSIS — R110 Nausea: Secondary | ICD-10-CM

## 2018-11-25 DIAGNOSIS — R1032 Left lower quadrant pain: Secondary | ICD-10-CM | POA: Diagnosis not present

## 2018-11-25 DIAGNOSIS — R42 Dizziness and giddiness: Secondary | ICD-10-CM | POA: Diagnosis not present
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
## 2018-11-25 NOTE — Patient Instructions (Signed)
To help prevent the possible spread of infection to our patients, communities, and staff; we will be implementing the following measures:  As of now we are not allowing any visitors/family members to accompany you to any upcoming appointments with [facility]. If you have any concerns about this please contact our office to discuss prior to the appointment.   Your provider has requested that you go to the basement level for lab work in our [place] location. Press "B" on the elevator. The lab is located at the first door on the left as you exit the elevator. [address] [place], [place] [address]  Please call our office at [phone] to set up your 2-3 month follow up visit.  It was a pleasure to see you today!  Vito Cirigliano, D.O.

## 2018-11-26 ENCOUNTER — Other Ambulatory Visit: Payer: Self-pay

## 2018-11-26 ENCOUNTER — Other Ambulatory Visit (INDEPENDENT_AMBULATORY_CARE_PROVIDER_SITE_OTHER): Payer: BLUE CROSS/BLUE SHIELD

## 2018-11-26 DIAGNOSIS — R1012 Left upper quadrant pain: Secondary | ICD-10-CM | POA: Diagnosis not present

## 2018-11-26 DIAGNOSIS — R110 Nausea: Secondary | ICD-10-CM

## 2018-11-26 LAB — C-REACTIVE PROTEIN: CRP: <1 mg/dL (ref 0.5–20.0)

## 2018-11-26 LAB — SEDIMENTATION RATE: Sed Rate: 7 mm/hr (ref 0–20)

## 2018-11-26 NOTE — Addendum Note (Signed)
Addended by: Miguel Aschoff on: 11/26/2018 02:48 PM   Modules accepted: Orders

## 2018-11-26 NOTE — Addendum Note (Signed)
Addended by: Miguel Aschoff on: 11/26/2018 02:47 PM   Modules accepted: Orders

## 2018-11-29 LAB — SPECIMEN STATUS REPORT

## 2018-12-03 LAB — PORPHYRINS, FRACTIONATED, RANDOM URINE
Coproporphyrin (CP) I: 22 ug/L — ABNORMAL HIGH (ref 0–15)
Coproporphyrin (CP) III: 33 ug/L (ref 0–49)
Heptacarboxyl (7-CP): 4 ug/L — ABNORMAL HIGH (ref 0–2)
Hexacarboxyl (6-CP): <1 ug/L (ref 0–1)
Pentacarboxyl (5-CP): 1 ug/L (ref 0–2)
Uroporphyrins (UP): 13 ug/L (ref 0–20)

## 2018-12-03 LAB — PORPHOBILINOGEN, RANDOM URINE

## 2018-12-03 LAB — CREATININE, URINE, RANDOM: Creatinine, Urine: 119.2 mg/dL

## 2018-12-08 ENCOUNTER — Telehealth: Payer: Self-pay | Admitting: Gastroenterology

## 2018-12-08 DIAGNOSIS — R1012 Left upper quadrant pain: Secondary | ICD-10-CM

## 2018-12-08 NOTE — Telephone Encounter (Signed)
Pt called to speak with the nurse to go over the results of her urine testing.

## 2018-12-08 NOTE — Telephone Encounter (Signed)
I spoke with the pt and she is aware that she needs to repeat the urine test to prevent light exposure.  The pt has been advised of the information and verbalized understanding.

## 2018-12-08 NOTE — Telephone Encounter (Signed)
Left message on machine to call back  

## 2018-12-18 ENCOUNTER — Other Ambulatory Visit: Payer: Self-pay | Admitting: Gastroenterology

## 2018-12-25 LAB — PORPHOBILINOGEN, RANDOM URINE: Porphobilinogen, Rand Ur: 0.6 mg/L (ref 0.0–2.0)

## 2018-12-29 NOTE — Telephone Encounter (Signed)
Called our lab and and they have not received a urine sample for the PBG test. Called patient and left message to call back. See if patient gave her sample directly to [employer] ?

## 2018-12-29 NOTE — Telephone Encounter (Signed)
Pt inquired about results of second urine test.

## 2019-06-18 ENCOUNTER — Other Ambulatory Visit: Payer: Self-pay

## 2019-06-18 DIAGNOSIS — Z20822 Contact with and (suspected) exposure to covid-19: Secondary | ICD-10-CM

## 2019-06-20 LAB — NOVEL CORONAVIRUS, NAA: SARS-CoV-2, NAA: DETECTED — AB

## 2019-09-11 ENCOUNTER — Other Ambulatory Visit: Payer: Self-pay | Admitting: Physician Assistant

## 2019-09-11 ENCOUNTER — Ambulatory Visit
Admission: RE | Admit: 2019-09-11 | Discharge: 2019-09-11 | Disposition: A | Discharge status: Home / Self Care | Payer: BC Managed Care – PPO | Source: Ambulatory Visit | Attending: Physician Assistant | Admitting: Physician Assistant

## 2019-09-11 DIAGNOSIS — R062 Wheezing: Secondary | ICD-10-CM

## 2019-09-11 DIAGNOSIS — R0602 Shortness of breath: Secondary | ICD-10-CM

## 2019-09-11 DIAGNOSIS — U071 COVID-19: Secondary | ICD-10-CM

## 2019-12-03 ENCOUNTER — Other Ambulatory Visit: Payer: Self-pay | Admitting: *Deleted

## 2019-12-03 ENCOUNTER — Encounter: Payer: Self-pay | Admitting: *Deleted

## 2019-12-04 ENCOUNTER — Ambulatory Visit: Payer: BC Managed Care – PPO | Admitting: Neurology

## 2019-12-04 ENCOUNTER — Other Ambulatory Visit: Payer: Self-pay

## 2019-12-04 ENCOUNTER — Encounter: Payer: Self-pay | Admitting: Neurology

## 2019-12-04 VITALS — BP 127/81 | HR 93 | Ht 63.5 in | Wt 145.6 lb

## 2019-12-04 DIAGNOSIS — R202 Paresthesia of skin: Secondary | ICD-10-CM | POA: Diagnosis not present
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
## 2019-12-10 ENCOUNTER — Telehealth: Payer: Self-pay | Admitting: *Deleted

## 2019-12-10 NOTE — Telephone Encounter (Signed)
Pt called need to know when will we schedule her MRI. Please [phone].

## 2019-12-14 NOTE — Telephone Encounter (Signed)
LVM for pt to call back about scheduling mri  [employer] Auth: [id number] (exp. 12/11/19 to 06/07/20)

## 2019-12-14 NOTE — Telephone Encounter (Signed)
Patient returned my call she is scheduled at [facility] for 12/16/19.

## 2019-12-15 ENCOUNTER — Ambulatory Visit: Payer: BC Managed Care – PPO | Admitting: Pulmonary Disease

## 2019-12-15 ENCOUNTER — Other Ambulatory Visit: Payer: Self-pay

## 2019-12-15 ENCOUNTER — Encounter: Payer: Self-pay | Admitting: Pulmonary Disease

## 2019-12-15 VITALS — BP 118/76 | HR 82 | Temp 98.2°F | Ht 64.0 in | Wt 145.4 lb

## 2019-12-15 DIAGNOSIS — R0602 Shortness of breath: Secondary | ICD-10-CM
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
## 2019-12-15 NOTE — Patient Instructions (Signed)
History of asthma Shortness of breath with activity  Post Covid fatigue, shortness of breath  Graded exercises as tolerated  At some point if symptoms persist obtain a chest x-ray Obtain pulmonary function test  Follow-up in 3 months

## 2019-12-16 ENCOUNTER — Ambulatory Visit: Payer: BC Managed Care – PPO

## 2019-12-16 DIAGNOSIS — R202 Paresthesia of skin: Secondary | ICD-10-CM

## 2019-12-16 MED ORDER — GADOBENATE DIMEGLUMINE 529 MG/ML IV SOLN
15.0000 mL | Freq: Once | INTRAVENOUS | Status: AC | PRN
  Administered 2019-12-16: 15 mL via INTRAVENOUS

## 2019-12-18 ENCOUNTER — Telehealth: Payer: Self-pay | Admitting: Neurology

## 2019-12-18 DIAGNOSIS — R202 Paresthesia of skin: Secondary | ICD-10-CM

## 2019-12-18 NOTE — Telephone Encounter (Signed)
I called the patient.  The MRI of the brain and cervical spine were normal.  If symptoms persist, we can check further blood work looking for the source of the sensory changes.    MRI brain 12/17/19:  IMPRESSION:   Normal MRI brain (with and without).    MRI cervical 12/17/19:  IMPRESSION:   Normal MRI cervical spine (with and without).

## 2019-12-22 NOTE — Telephone Encounter (Signed)
Pt would like blood work that was recommend.

## 2019-12-22 NOTE — Telephone Encounter (Signed)
I return pts call. PT stated she receive the MRI brain and MR cervical spine came back normal. Pt wanted to know the next steps. I stated per his note if she is still having symptoms he recommend blood work to check for sensory change.PT stated she is still having tingling in face, arm and back.I stated message will be sent to Dr Anne Hahn.Pt verbalized understanding.

## 2019-12-22 NOTE — Telephone Encounter (Signed)
Pt called wanting to discuss what the next step will be now that her MRI results came back normal. Please advise.

## 2019-12-24 NOTE — Telephone Encounter (Signed)
I called the patient.  MRI results were unremarkable, I will set up some blood work for her, she will come in at her convenience to get the blood drawn.

## 2019-12-24 NOTE — Addendum Note (Signed)
Addended by: York Spaniel on: 12/24/2019 11:08 AM   Modules accepted: Orders

## 2020-01-04 ENCOUNTER — Other Ambulatory Visit (INDEPENDENT_AMBULATORY_CARE_PROVIDER_SITE_OTHER): Payer: Self-pay

## 2020-01-04 ENCOUNTER — Other Ambulatory Visit: Payer: Self-pay

## 2020-01-04 DIAGNOSIS — R202 Paresthesia of skin: Secondary | ICD-10-CM

## 2020-01-04 DIAGNOSIS — Z0289 Encounter for other administrative examinations: Secondary | ICD-10-CM

## 2020-01-06 DIAGNOSIS — R002 Palpitations: Secondary | ICD-10-CM | POA: Insufficient documentation

## 2020-01-06 DIAGNOSIS — Z7189 Other specified counseling: Secondary | ICD-10-CM | POA: Insufficient documentation

## 2020-01-06 DIAGNOSIS — R42 Dizziness and giddiness: Secondary | ICD-10-CM | POA: Insufficient documentation
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
## 2020-01-07 ENCOUNTER — Other Ambulatory Visit: Payer: Self-pay

## 2020-01-07 ENCOUNTER — Ambulatory Visit: Payer: BC Managed Care – PPO | Admitting: Cardiology

## 2020-01-07 ENCOUNTER — Encounter: Payer: Self-pay | Admitting: Cardiology

## 2020-01-07 ENCOUNTER — Encounter: Payer: Self-pay | Admitting: *Deleted

## 2020-01-07 VITALS — BP 110/80 | HR 89 | Temp 97.2°F | Ht 63.0 in | Wt 146.0 lb

## 2020-01-07 DIAGNOSIS — R002 Palpitations: Secondary | ICD-10-CM | POA: Diagnosis not present

## 2020-01-07 DIAGNOSIS — R42 Dizziness and giddiness: Secondary | ICD-10-CM | POA: Diagnosis not present

## 2020-01-07 DIAGNOSIS — Z7189 Other specified counseling: Secondary | ICD-10-CM

## 2020-01-07 LAB — SEDIMENTATION RATE: Sed Rate: 2 mm/hr (ref 0–32)

## 2020-01-07 LAB — COPPER, SERUM: Copper: 106 ug/dL (ref 80–158)

## 2020-01-07 LAB — B. BURGDORFI ANTIBODIES: Lyme IgG/IgM Ab: <0.91 {ISR} (ref 0.00–0.90)

## 2020-01-07 LAB — VITAMIN B12: Vitamin B-12: 460 pg/mL (ref 232–1245)

## 2020-01-07 LAB — ANGIOTENSIN CONVERTING ENZYME: Angio Convert Enzyme: 64 U/L (ref 14–82)

## 2020-01-07 LAB — HEPATITIS C ANTIBODY: Hep C Virus Ab: <0.1 s/co ratio (ref 0.0–0.9)

## 2020-01-07 LAB — ANA W/REFLEX: Anti Nuclear Antibody (ANA): NEGATIVE

## 2020-01-07 NOTE — Progress Notes (Signed)
Patient ID: Diane Wood, female   DOB: 16-Dec-1990, 29 y.o.   MRN: [id number] Patient enrolled for [employer] to ship a 14ay ZIO XT long term holter monitor to her home.

## 2020-01-07 NOTE — Patient Instructions (Signed)
Medication Instructions:  CONTINUE WITH CURRENT MEDICATIONS. NO CHANGES.  *If you need a refill on your cardiac medications before your next appointment, please call your pharmacy*    Testing/Procedures: ZIO XT- Long Term Monitor Instructions   Your physician has requested you wear your ZIO patch monitor__14__days.   This is a single patch monitor.  [employer] supplies one patch monitor per enrollment.  Additional stickers are not available.   Please do not apply patch if you will be having a Nuclear Stress Test, Echocardiogram, Cardiac CT, MRI, or Chest Xray during the time frame you would be wearing the monitor. The patch cannot be worn during these tests.  You cannot remove and re-apply the ZIO XT patch monitor.   Your ZIO patch monitor will be sent USPS Priority mail from [facility] directly to your home address. The monitor may also be mailed to a PO BOX if home delivery is not available.   It may take 3-5 days to receive your monitor after you have been enrolled.   Once you have received you monitor, please review enclosed instructions.  Your monitor has already been registered assigning a specific monitor serial # to you.   Applying the monitor   Shave hair from upper left chest.   Hold abrader disc by orange tab.  Rub abrader in 40 strokes over left upper chest as indicated in your monitor instructions.   Clean area with 4 enclosed alcohol pads .  Use all pads to assure are is cleaned thoroughly.  Let dry.   Apply patch as indicated in monitor instructions.  Patch will be place under collarbone on left side of chest with arrow pointing upward.   Rub patch adhesive wings for 2 minutes.Remove white label marked "1".  Remove white label marked "2".  Rub patch adhesive wings for 2 additional minutes.   While looking in a mirror, press and release button in center of patch.  A small green light will flash 3-4 times .  This will be your only indicator the monitor has been  turned on.     Do not shower for the first 24 hours.  You may shower after the first 24 hours.   Press button if you feel a symptom. You will hear a small click.  Record Date, Time and Symptom in the Patient Log Book.   When you are ready to remove patch, follow instructions on last 2 pages of Patient Log Book.  Stick patch monitor onto last page of Patient Log Book.   Place Patient Log Book in [place] box.  Use locking tab on box and tape box closed securely.  The [employer] and [employer] has [employer] on it.  Please place in mailbox as soon as possible.  Your physician should have your test results approximately 7 days after the monitor has been mailed back to [facility].   Call [facility] Customer Care at [phone] if you have questions regarding your ZIO XT patch monitor.  Call them immediately if you see an orange light blinking on your monitor.   If your monitor falls off in less than 4 days contact our Monitor department at [phone].  If your monitor becomes loose or falls off after 4 days call [employer] at [phone] for suggestions on securing your monitor.     Follow-Up: FOLLOW UP WITH DR.HOCHREIN 6 WEEKS AFTER MONITOR At [facility], you and your health needs are our priority.  As part of our continuing mission to provide you with exceptional  heart care, we have created designated Provider Care Teams.  These Care Teams include your primary Cardiologist (physician) and Advanced Practice Providers (APPs -  Physician Assistants and Nurse Practitioners) who all work together to provide you with the care you need, when you need it.  We recommend signing up for the patient portal called "MyChart".  Sign up information is provided on this After Visit Summary.  MyChart is used to connect with patients for Virtual Visits (Telemedicine).  Patients are able to view lab/test results, encounter notes, upcoming appointments, etc.  Non-urgent messages can be sent to your provider  as well.   To learn more about what you can do with MyChart, go to [email or web address].    Your next appointment:   2 month(s)  The format for your next appointment:   In Person Provider:   DR.HOCHREIN

## 2020-01-13 ENCOUNTER — Other Ambulatory Visit (INDEPENDENT_AMBULATORY_CARE_PROVIDER_SITE_OTHER): Payer: BC Managed Care – PPO

## 2020-01-13 DIAGNOSIS — R42 Dizziness and giddiness: Secondary | ICD-10-CM | POA: Diagnosis not present

## 2020-01-13 DIAGNOSIS — Z7189 Other specified counseling: Secondary | ICD-10-CM | POA: Diagnosis not present

## 2020-01-13 DIAGNOSIS — R002 Palpitations: Secondary | ICD-10-CM

## 2020-01-18 ENCOUNTER — Ambulatory Visit: Payer: BC Managed Care – PPO | Attending: Internal Medicine

## 2020-01-18 DIAGNOSIS — Z20822 Contact with and (suspected) exposure to covid-19: Secondary | ICD-10-CM

## 2020-01-19 LAB — SARS-COV-2, NAA 2 DAY TAT

## 2020-01-19 LAB — NOVEL CORONAVIRUS, NAA: SARS-CoV-2, NAA: NOT DETECTED

## 2020-02-05 IMAGING — CT CT ABD-PELV W/ CM
2 of 4 series · 16 of 46 positions shown, 18 images · IV contrast (omnipaque)
Comparison: Pelvis ultrasound obtained earlier today.

CLINICAL DATA: Left lower quadrant abdominal pain for the past 2
months.

EXAM:
CT ABDOMEN AND PELVIS WITH CONTRAST
TECHNIQUE: Multidetector CT imaging of the abdomen and pelvis was performed
using the standard protocol following bolus administration of
intravenous contrast.
CONTRAST:  100mL OMNIPAQUE IOHEXOL 300 MG/ML  SOLN

[Series 3: abd/ pelvis 5.0 i30f 2 · axial · 0.86mm/px · z∈[+622,+1052]mm · 13 of 94 slices shown, 15 images]
[im 4/94  soft-tissue]
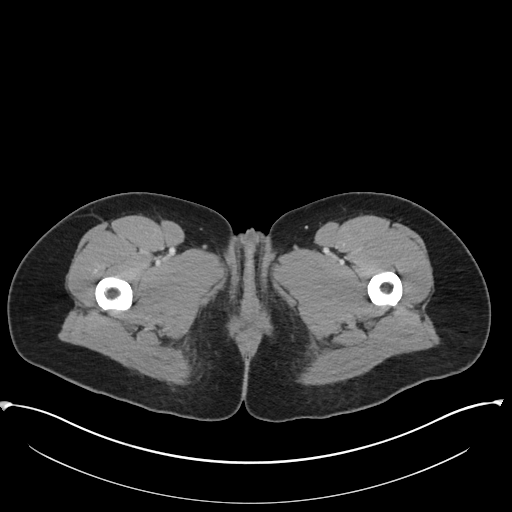
[im 4/94  bone]
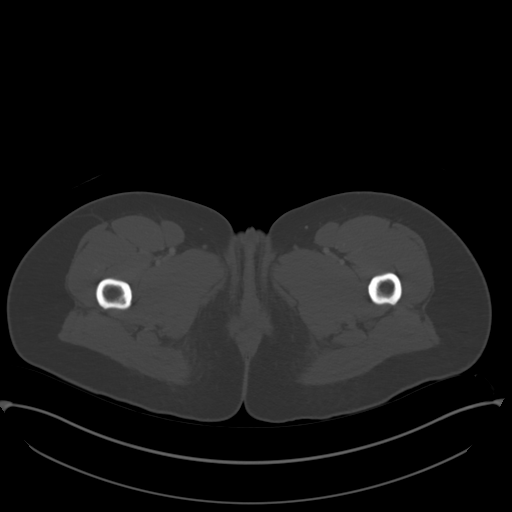
[im 12/94  soft-tissue]
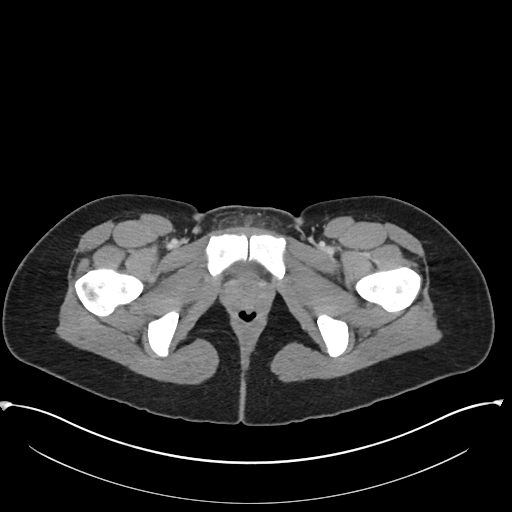
[im 20/94  soft-tissue]
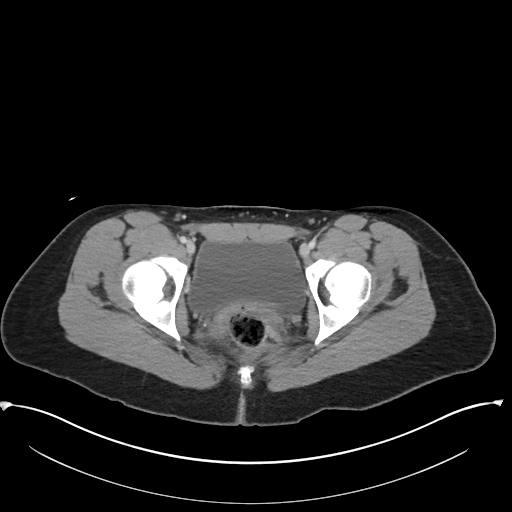
[im 28/94  soft-tissue]
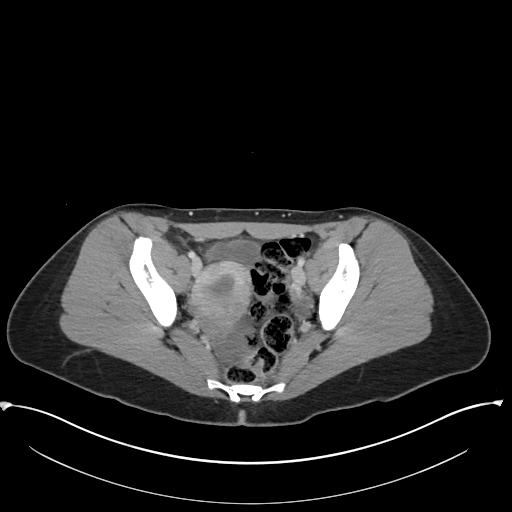
[im 32/94  soft-tissue]
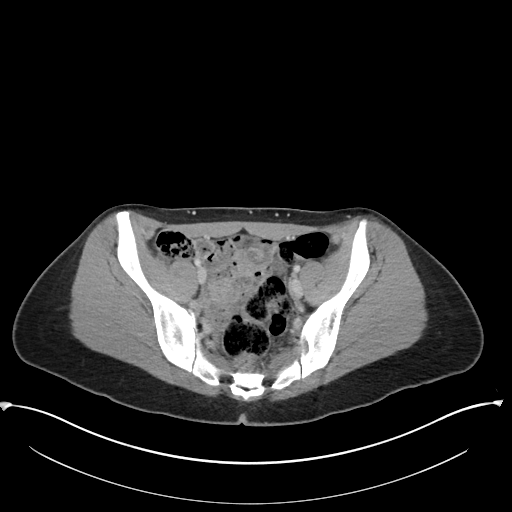
[im 39/94  soft-tissue]
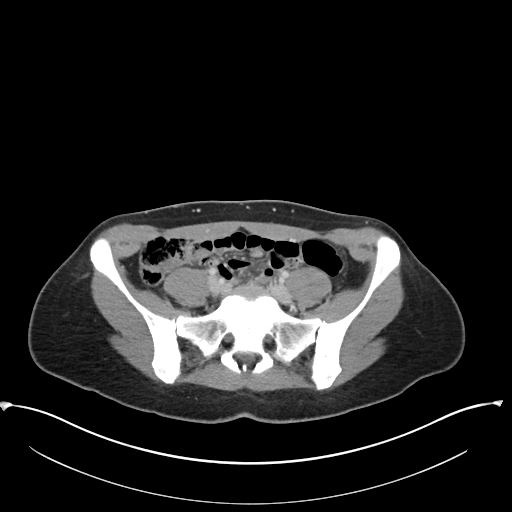
[im 47/94  soft-tissue]
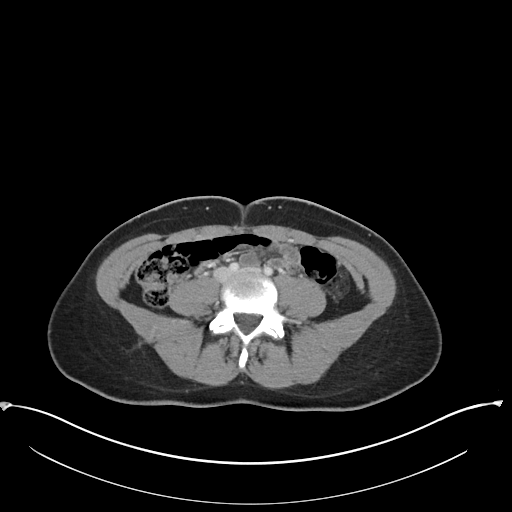
[im 55/94  soft-tissue]
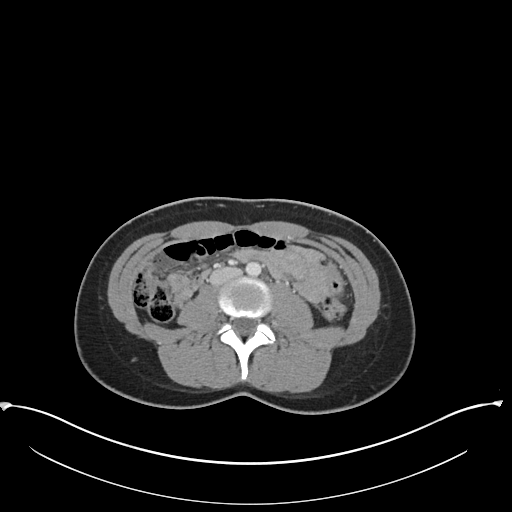
[im 63/94  soft-tissue]
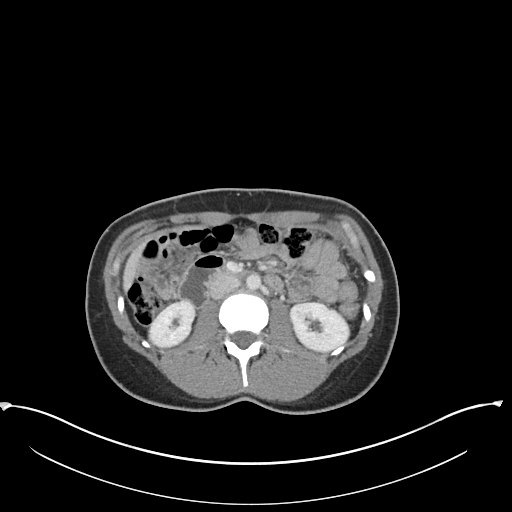
[im 63/94  bone]
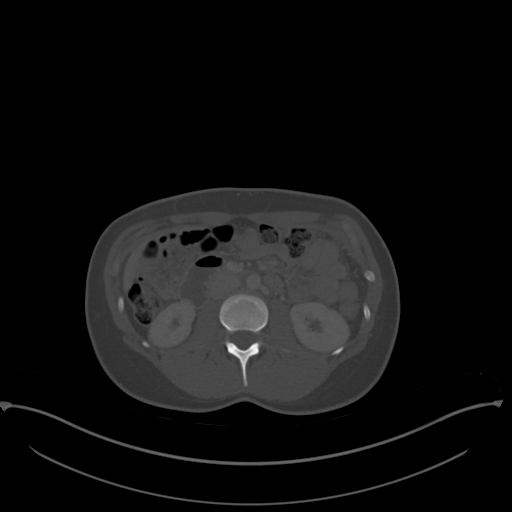
[im 66/94  soft-tissue]
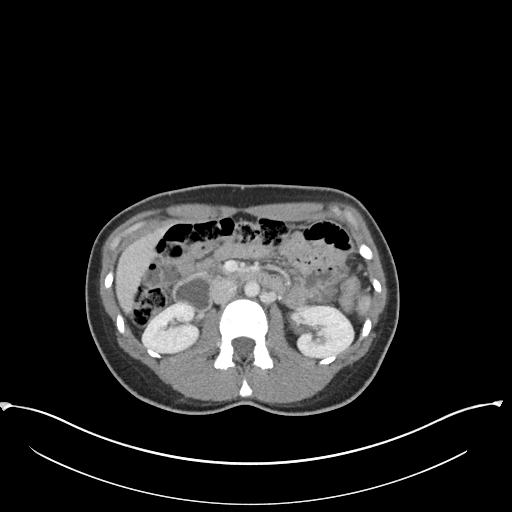
[im 74/94  soft-tissue]
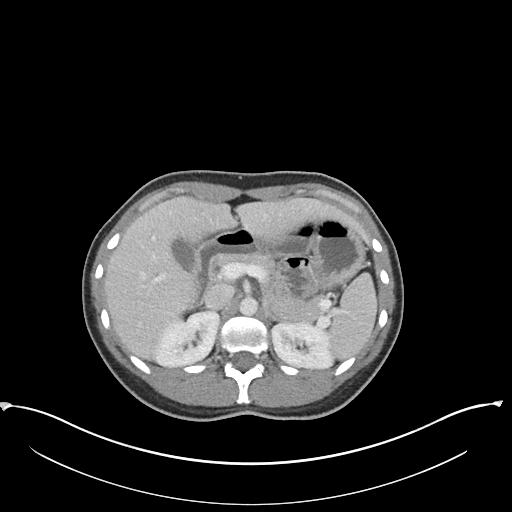
[im 82/94  soft-tissue]
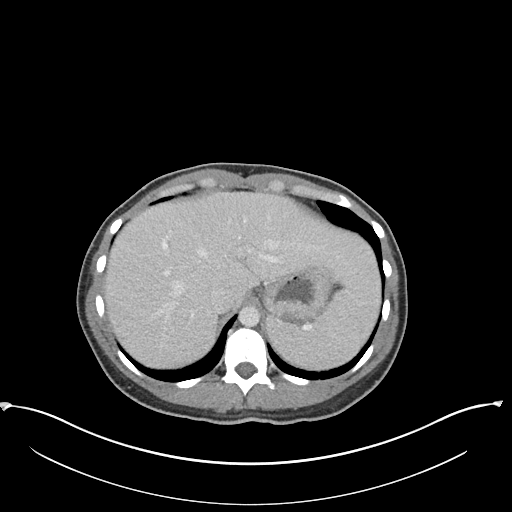
[im 90/94  soft-tissue]
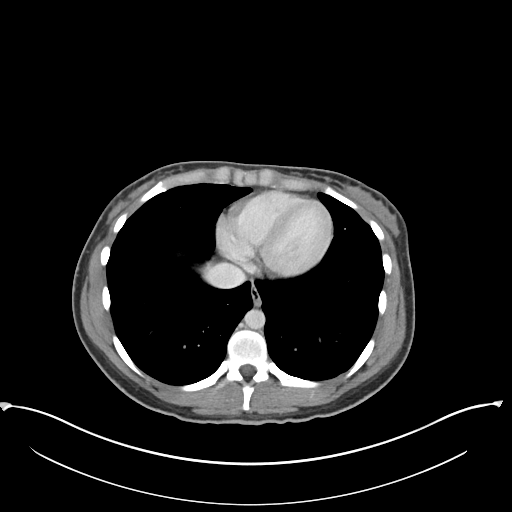

[Series 6: coronal soft tissue · coronal · 0.81mm/px · 3 of 87 slices shown]
[im 29/87  soft-tissue]
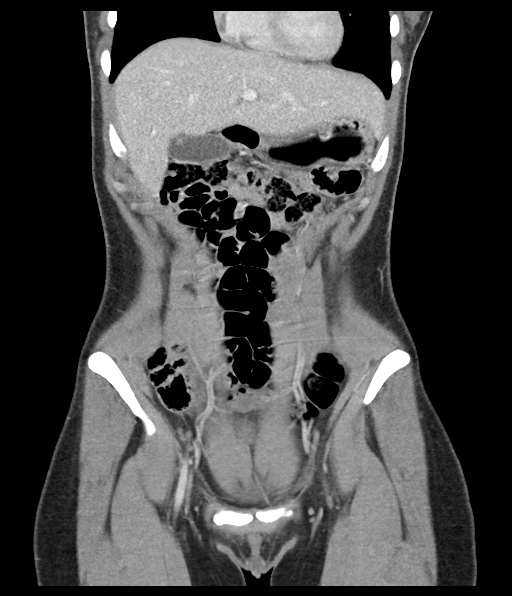
[im 39/87  soft-tissue]
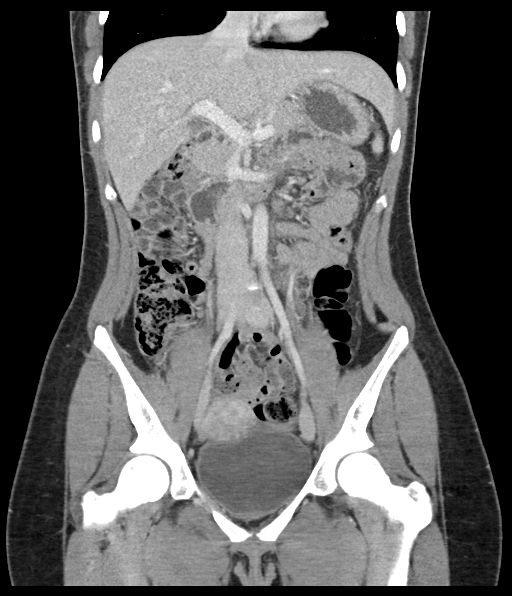
[im 48/87  soft-tissue]
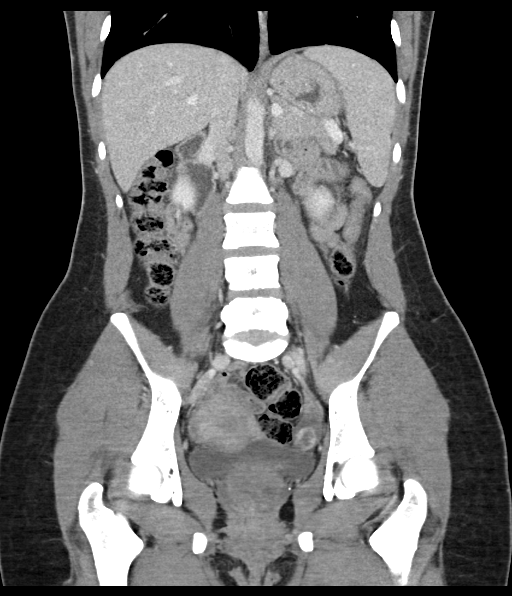

[16 of 46 positions shown; findings below may reference images not displayed]

FINDINGS: Lower chest: Minimal linear atelectasis or scarring at the left lung
base.

Hepatobiliary: Normal appearing gallbladder. 4 mm oval area of low
density in the anterior aspect of the medial segment of the left
lobe of the liver on image number 15 series 3. The remainder of the
liver has a normal appearance.

Pancreas: Unremarkable. No pancreatic ductal dilatation or
surrounding inflammatory changes.

Spleen: Normal in size without focal abnormality.

Adrenals/Urinary Tract: Adrenal glands are unremarkable. Kidneys are
normal, without renal calculi, focal lesion, or hydronephrosis.
Bladder is unremarkable.

Stomach/Bowel: Stomach is within normal limits. Appendix appears
normal. No evidence of bowel wall thickening, distention, or
inflammatory changes.

Vascular/Lymphatic: No significant vascular findings are present. No
enlarged abdominal or pelvic lymph nodes.

Reproductive: Previously noted left ovarian corpus luteum.
Endometrial low density corresponding to the heterogeneity seen at
ultrasound. The CT features are normal. Normal appearing right
ovary, better seen on the ultrasound.

Other: Small amount of free peritoneal fluid in the pelvic
cul-de-sac, within normal limits of physiological fluid.

Musculoskeletal: Minimal lower thoracic spine degenerative changes.
IMPRESSION: 1. No acute abnormality.
2. 4 mm nonspecific oval area of low density in the left lobe of the
liver. This is of doubtful clinical significance and may represent a
small cyst or hemangioma.

## 2020-02-05 IMAGING — US US TRANSVAGINAL NON-OB
1 series · 13 of 25 positions shown · non-contrast
Comparison: None.

CLINICAL DATA: Left lower quadrant pain for 2 months

EXAM:
TRANSABDOMINAL AND TRANSVAGINAL ULTRASOUND OF PELVIS
DOPPLER ULTRASOUND OF OVARIES
TECHNIQUE: Both transabdominal and transvaginal ultrasound examinations of the
pelvis were performed. Transabdominal technique was performed for
global imaging of the pelvis including uterus, ovaries, adnexal
regions, and pelvic cul-de-sac.
It was necessary to proceed with endovaginal exam following the
transabdominal exam to visualize the endometrium and ovaries. Color
and duplex Doppler ultrasound was utilized to evaluate blood flow to
the ovaries.

[Series 1: us transvaginal non-ob · 94 acquisitions, 13 frames shown]
[im 1/94]
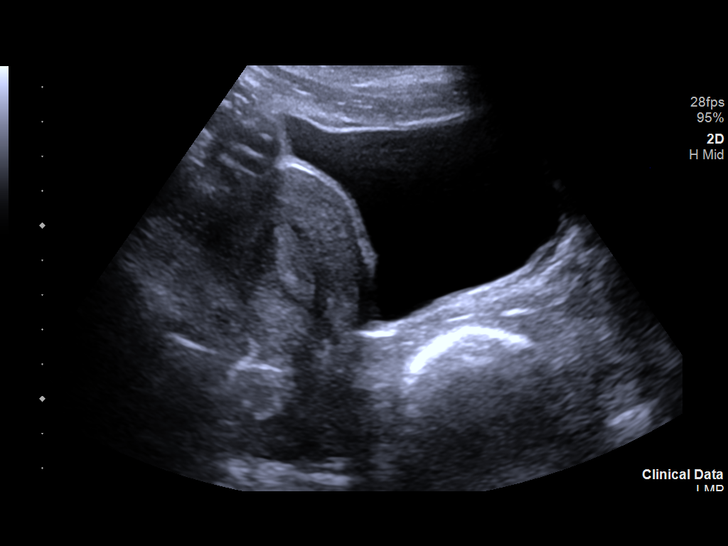
[im 8/94]
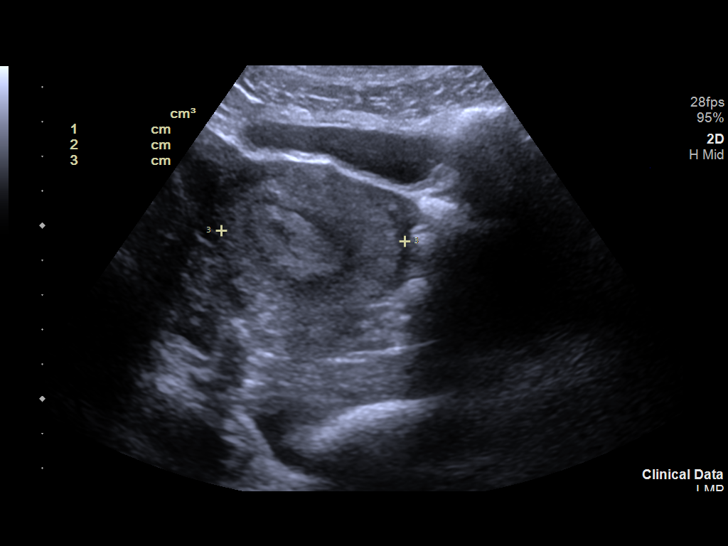
[im 16/94]
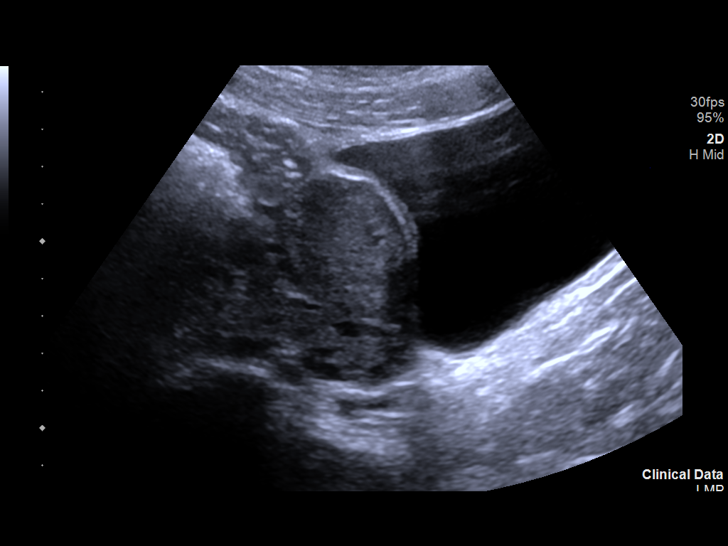
[im 24/94]
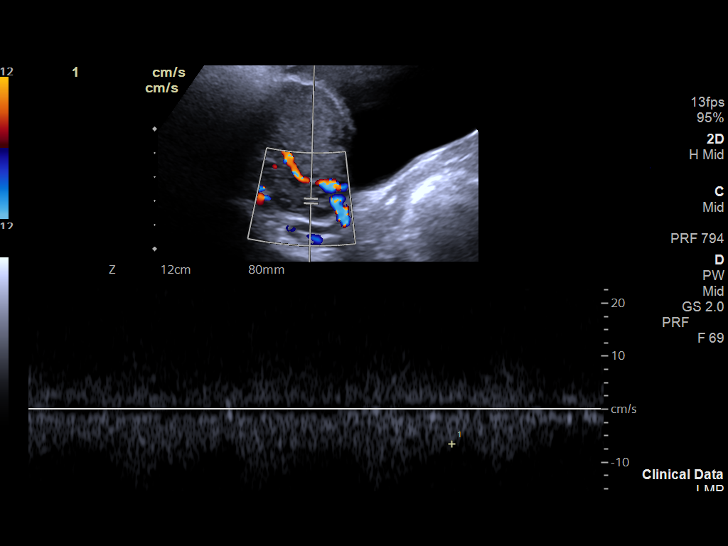
[im 32/94]
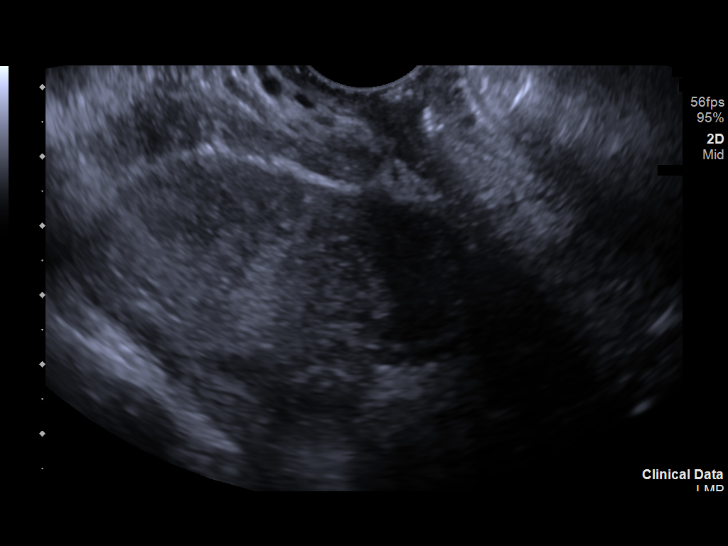
[im 39/94]
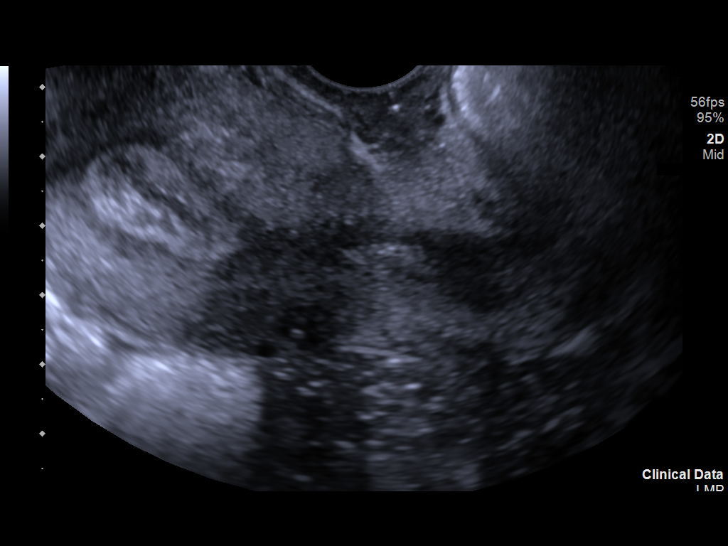
[im 47/94]
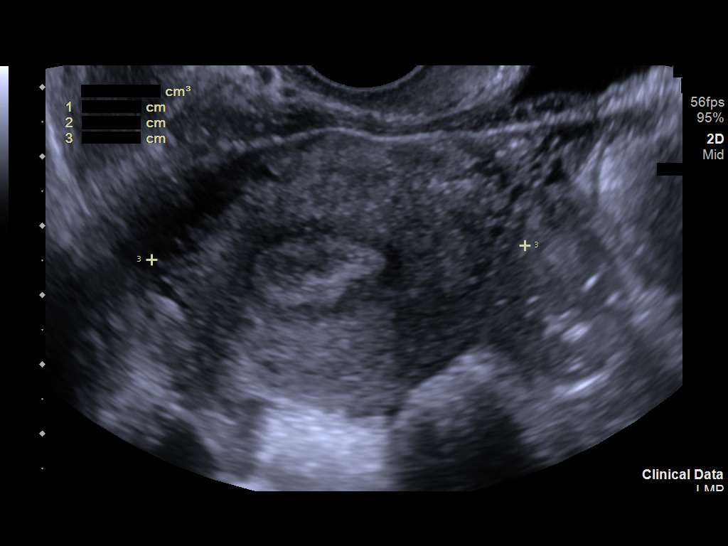
[im 55/94]
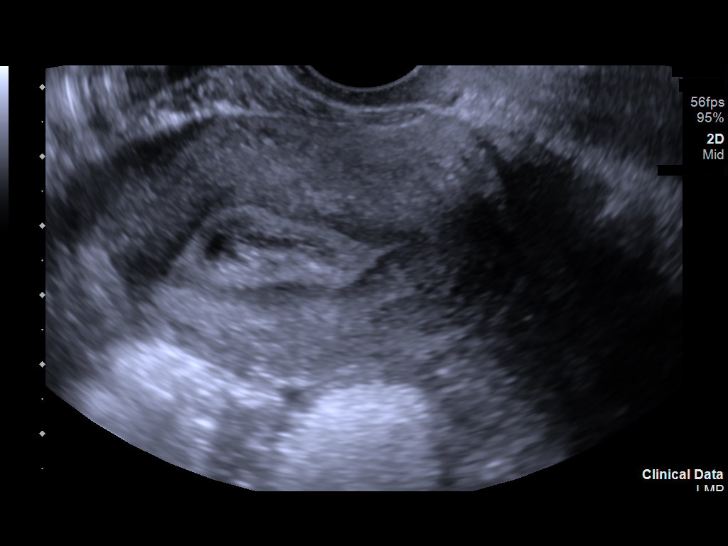
[im 63/94]
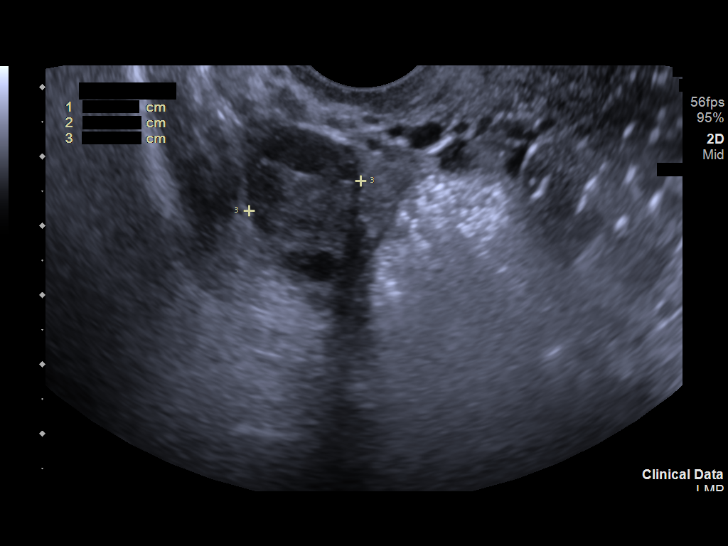
[im 70/94]
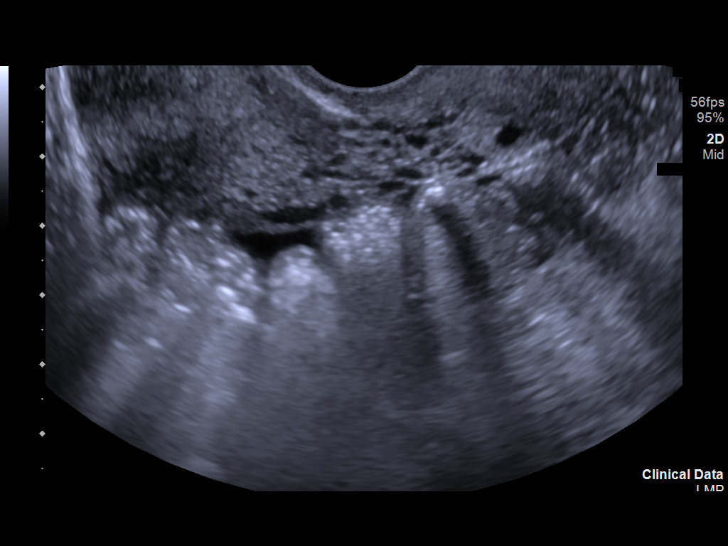
[im 78/94]
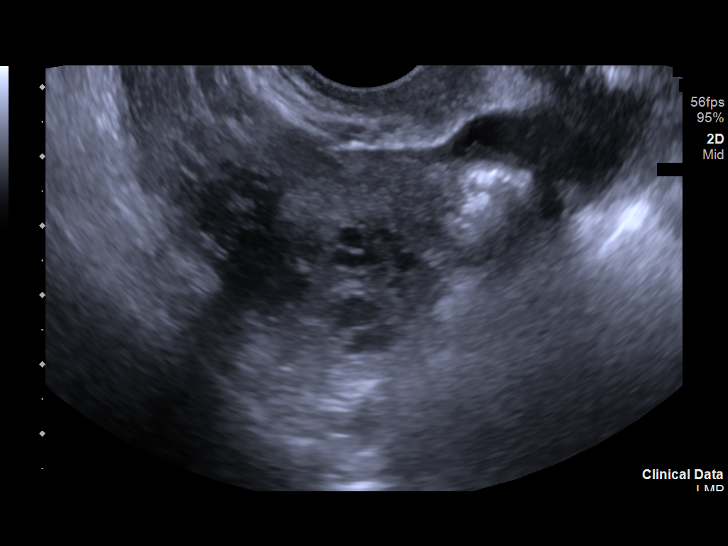
[im 86/94]
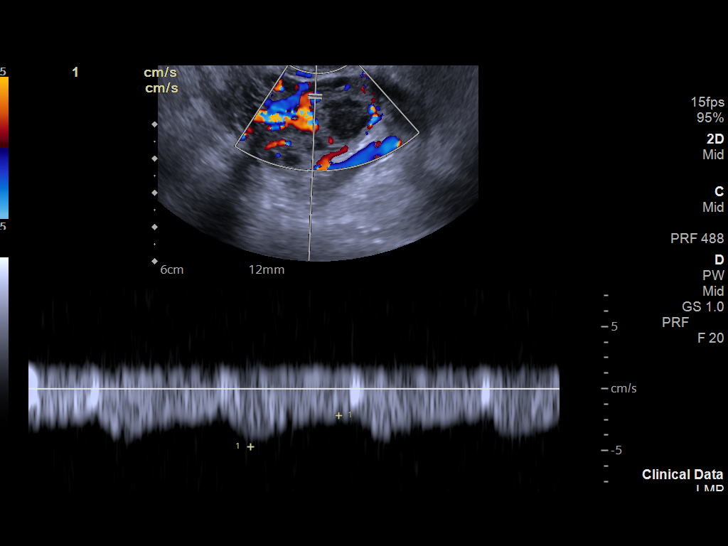
[im 94/94]
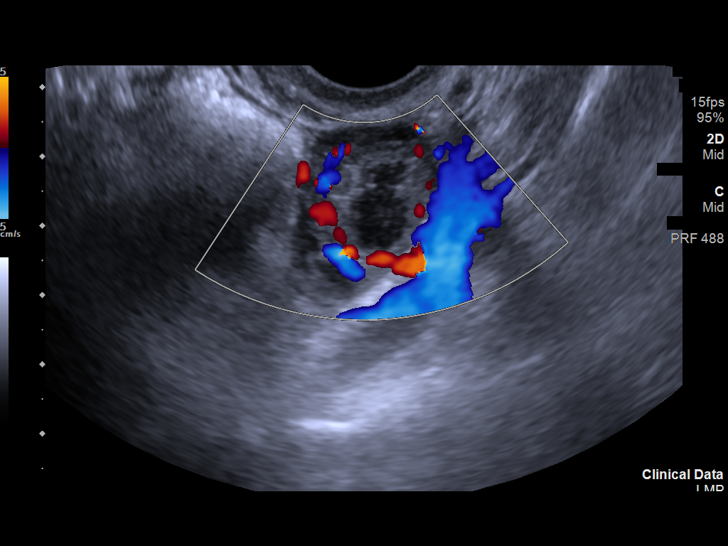

[13 of 25 positions shown; findings below may reference images not displayed]

FINDINGS: Uterus

Measurements: 8.9 x 4.1 x 5.4 cm = volume: 102.4 mL. No fibroids or
other mass visualized.

Endometrium

Thickness: 12.1 mm. Heterogeneous with no focal mass or vascularity.

Right ovary

Measurements: 3.2 x 2.1 x 1.7 cm = volume: 5.9 mL. Normal
appearance/no adnexal mass.

Left ovary

Measurements: 4.1 x 2.6 x 2.0 cm = volume: 10.9 mL. Contains a
cm complex cyst, likely a corpus luteum cyst.

Pulsed Doppler evaluation of both ovaries demonstrates normal
low-resistance arterial and venous waveforms.

Other findings

There is a small amount of physiologic fluid in the pelvis.
IMPRESSION: 1. Probable corpus luteum/hemorrhagic cyst in the left ovary. No
evidence of ovarian torsion bilaterally.
2. The endometrium is heterogeneous in echotexture with no focal
mass or vascularity. This is of doubtful significance given the lack
of endometrial thickening and the lack of dysfunctional bleeding.

## 2020-03-17 ENCOUNTER — Ambulatory Visit: Payer: BC Managed Care – PPO | Admitting: Cardiology

## 2020-04-11 ENCOUNTER — Ambulatory Visit: Payer: BC Managed Care – PPO | Admitting: Neurology

## 2020-04-11 ENCOUNTER — Encounter: Payer: Self-pay | Admitting: Neurology
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
